# Patient Record
Sex: Female | Born: 1935 | Race: White | Hispanic: No | Smoking: Never smoker
Health system: Southern US, Community
[De-identification: ages and names within clinical notes are randomized; demographics above are authoritative.]

## PROBLEM LIST (undated history)

## (undated) DIAGNOSIS — R079 Chest pain, unspecified: Secondary | ICD-10-CM

## (undated) DIAGNOSIS — E113299 Type 2 diabetes mellitus with mild nonproliferative diabetic retinopathy without macular edema, unspecified eye: Secondary | ICD-10-CM

## (undated) DIAGNOSIS — E7800 Pure hypercholesterolemia, unspecified: Secondary | ICD-10-CM

## (undated) DIAGNOSIS — J302 Other seasonal allergic rhinitis: Secondary | ICD-10-CM

## (undated) DIAGNOSIS — R3915 Urgency of urination: Secondary | ICD-10-CM

## (undated) DIAGNOSIS — G20A1 Parkinson's disease without dyskinesia, without mention of fluctuations: Secondary | ICD-10-CM

## (undated) DIAGNOSIS — G20 Parkinson's disease: Secondary | ICD-10-CM

## (undated) DIAGNOSIS — E1142 Type 2 diabetes mellitus with diabetic polyneuropathy: Secondary | ICD-10-CM

## (undated) DIAGNOSIS — I10 Essential (primary) hypertension: Secondary | ICD-10-CM

## (undated) DIAGNOSIS — K219 Gastro-esophageal reflux disease without esophagitis: Secondary | ICD-10-CM

## (undated) DIAGNOSIS — M790 Rheumatism, unspecified: Secondary | ICD-10-CM

## (undated) DIAGNOSIS — M5126 Other intervertebral disc displacement, lumbar region: Secondary | ICD-10-CM

## (undated) DIAGNOSIS — E785 Hyperlipidemia, unspecified: Secondary | ICD-10-CM

## (undated) DIAGNOSIS — E119 Type 2 diabetes mellitus without complications: Secondary | ICD-10-CM

## (undated) DIAGNOSIS — I259 Chronic ischemic heart disease, unspecified: Secondary | ICD-10-CM

## (undated) DIAGNOSIS — M1990 Unspecified osteoarthritis, unspecified site: Secondary | ICD-10-CM

## (undated) DIAGNOSIS — M5136 Other intervertebral disc degeneration, lumbar region: Secondary | ICD-10-CM

## (undated) DIAGNOSIS — I272 Pulmonary hypertension, unspecified: Secondary | ICD-10-CM

## (undated) DIAGNOSIS — M722 Plantar fascial fibromatosis: Secondary | ICD-10-CM

## (undated) DIAGNOSIS — B029 Zoster without complications: Secondary | ICD-10-CM

## (undated) DIAGNOSIS — I739 Peripheral vascular disease, unspecified: Secondary | ICD-10-CM

## (undated) HISTORY — DX: Peripheral vascular disease, unspecified: I73.9

## (undated) HISTORY — DX: Other intervertebral disc degeneration, lumbar region: M51.36

## (undated) HISTORY — DX: Urgency of urination: R39.15

## (undated) HISTORY — PX: TUBAL LIGATION: SHX77

## (undated) HISTORY — DX: Rheumatism, unspecified: M79.0

## (undated) HISTORY — DX: Type 2 diabetes mellitus with mild nonproliferative diabetic retinopathy without macular edema, unspecified eye: E11.3299

## (undated) HISTORY — DX: Zoster without complications: B02.9

## (undated) HISTORY — PX: CATARACT EXTRACTION: SUR2

## (undated) HISTORY — DX: Unspecified osteoarthritis, unspecified site: M19.90

## (undated) HISTORY — PX: BACK SURGERY: SHX140

## (undated) HISTORY — PX: CHOLECYSTECTOMY: SHX55

## (undated) HISTORY — PX: APPENDECTOMY: SHX54

## (undated) HISTORY — DX: Gastro-esophageal reflux disease without esophagitis: K21.9

## (undated) HISTORY — DX: Other seasonal allergic rhinitis: J30.2

## (undated) HISTORY — DX: Pulmonary hypertension, unspecified: I27.20

## (undated) HISTORY — DX: Parkinson's disease without dyskinesia, without mention of fluctuations: G20.A1

## (undated) HISTORY — DX: Plantar fascial fibromatosis: M72.2

## (undated) HISTORY — DX: Hyperlipidemia, unspecified: E78.5

## (undated) HISTORY — DX: Chronic ischemic heart disease, unspecified: I25.9

## (undated) HISTORY — DX: Type 2 diabetes mellitus with diabetic polyneuropathy: E11.42

## (undated) HISTORY — DX: Other intervertebral disc displacement, lumbar region: M51.26

## (undated) HISTORY — DX: Chest pain, unspecified: R07.9

## (undated) HISTORY — PX: SHOULDER SURGERY: SHX246

## (undated) HISTORY — DX: Pure hypercholesterolemia, unspecified: E78.00

## (undated) HISTORY — DX: Parkinson's disease: G20

---

## 2003-09-27 ENCOUNTER — Ambulatory Visit (HOSPITAL_COMMUNITY): Admission: RE | Admit: 2003-09-27 | Discharge: 2003-09-27 | Payer: Self-pay | Admitting: Neurosurgery

## 2003-10-09 ENCOUNTER — Inpatient Hospital Stay (HOSPITAL_COMMUNITY): Admission: RE | Admit: 2003-10-09 | Discharge: 2003-10-16 | Payer: Self-pay | Admitting: Neurosurgery

## 2003-10-13 ENCOUNTER — Encounter: Payer: Self-pay | Admitting: Cardiology

## 2008-02-14 ENCOUNTER — Encounter: Admission: RE | Admit: 2008-02-14 | Discharge: 2008-02-14 | Payer: Self-pay | Admitting: Neurology

## 2008-05-27 ENCOUNTER — Encounter: Admission: RE | Admit: 2008-05-27 | Discharge: 2008-05-27 | Payer: Self-pay | Admitting: Neurology

## 2008-08-21 ENCOUNTER — Encounter: Admission: RE | Admit: 2008-08-21 | Discharge: 2008-08-21 | Payer: Self-pay | Admitting: Neurology

## 2010-10-28 NOTE — Discharge Summary (Signed)
NAME:  Cynthia Dunlap, Cynthia Dunlap                           ACCOUNT NO.:  [id number]   MEDICAL RECORD NO.:  [id number]                   PATIENT TYPE:  INP   LOCATION:  3030                                 FACILITY:  [facility]   PHYSICIAN:  Hilda Lias, M.D.                DATE OF BIRTH:  06-Jun-1934   DATE OF ADMISSION:  10/09/2003  DATE OF DISCHARGE:  10/16/2003                                 DISCHARGE SUMMARY   ADMISSION DIAGNOSIS:  L5-S1 herniated disk with stenosis at the level of L4-  5.   FINAL DIAGNOSES:  1. L5-S1 herniated disk with stenosis at the level of L4-5.  2. Hypoxia, rule out sleep apnea.   CLINICAL HISTORY:  The patient was admitted because of chronic back pain  with radiation down to the leg.  X-rays showed that she has stenosis at the  level of L4-5 and a probable disk at the level of L5-S1.  Surgery was  advised.   LABORATORIES:  Normal at the beginning.  The only finding was after 72 hours  the oxygen being in the low 70s with an x-ray that showed pulmonary edema.   COURSE IN THE HOSPITAL:  The patient was taken to surgery and decompression  of the L4-5 and L5-S1 space was done.  The patient did really well.  She was  doing great, but her oxygen saturation was in the low 90s.  Oxygen was  given, but every time the oxygen was removed, her oxygen saturation dropped  into the 40s.  A pulmonary consult was made.  Chest x-ray showed that the  patient might have the possibility of pulmonary edema to rule out the  possibility of MI versus pulmonary emboli.  The patient has complete cardiac  workup, as well as of her lungs also.  The findings showed no evidence of  any pulmonary emboli and the cardiology workup was negative for MI.  Today  she is feeling much better.  After being cleared by the cardiologist and the  pulmonologist, she is being discharged to be followed by Dr. Delford Field, as well  as by me.   CONDITION ON DISCHARGE:  Improving.   MEDICATIONS:  She is going to be  taking Avelox, Advair, as well as Percocet  for pain.   DIET:  Regular.   ACTIVITY:  She is not to do any lifting.  She is not to bend.   FOLLOWUP:  She will be seen by Dr. Delford Field, as well as by me in my office.   CONDITION ON DISCHARGE:  Overall improvement of her medical and surgical  conditions.                                                Hilda Lias, M.D.   EB/MEDQ  D:  10/16/2003  T:  10/18/2003  Job:  [id number]

## 2010-10-28 NOTE — Consult Note (Signed)
NAME:  Cynthia Dunlap, Cynthia Dunlap                           ACCOUNT NO.:  [id number]   MEDICAL RECORD NO.:  [id number]                   PATIENT TYPE:  INP   LOCATION:  3104                                 FACILITY:  [facility]   PHYSICIAN:  Learta Codding, M.D. [facility]             DATE OF BIRTH:  03-30-1934   DATE OF CONSULTATION:  10/13/2003  DATE OF DISCHARGE:                                   CONSULTATION   REFERRING PHYSICIAN:  Shan Levans, M.D.   REFERRING SURGEON:  Hilda Lias, M.D.   CARDIOLOGIST:  Jaclynn Major, M.D.   REASON FOR CONSULTATION:  Evaluation of a 77 year old female with hypoxemia  and with concern of congestive heart failure.   HISTORY OF PRESENT ILLNESS:  The patient is a 77 year old female with a  prior cardiac history of prior myocardial infarction in 1993.  The patient  was recently admitted for back surgery.  She had a left L5-S1 herniated disk  with compromise of L5 and S1 nerve root.  She is now status post surgery  requiring foraminotomy and diskectomy.  The patient over the last day was  noted to have hypoxemia.  One report stated that the patient's saturation  may have dropped down to the 40% range.  However, clinically the patient has  had no complaints of chest pain or shortness of breath.  As a matter of  fact, the patient never at any given time was tachypneic.  She did receive a  significant amount of sedation and narcotics, which may actually have caused  some respiratory depression.  At the time of the patient's move to the ICU,  her respirations are only still nine per minute.  Critical care consultation  was called for.  Dr. Delford Field notified Dr. Andee Lineman for an evaluation of  possible congestive heart failure.  Chest x-ray demonstrated possible  congestive heart failure but clinically the patient does not appear to have  heart failure.  Saturation now also is 95%, and the blood gas demonstrates a  primary metabolic alkalosis.  The patient states that she  had had a recent  stress test approximately 14 months ago, which was negative for ischemia.  This was done in [place].  She never had a prior cardiac catheterization  done, however.  The patient has cardiac risk factors including diabetes and  hypertension and is significantly obese.  A 12-lead electrocardiogram does  not demonstrate any acute infarction or ischemia.  A bedside  echocardiographic study demonstrated normal LV function, no definite  segmental wall motion abnormalities.  An official echocardiographic report,  however, is pending.  The patient is now moved to the neuro ICU at 3100 unit  in no distress and no definite complaints. She diuresed approximately 2.2 L  even without the use of diuretics.  She still appears to be a little bit  somnolent.  She also appears pale, and she had a significant drop  in her  hemoglobin from 13.8 to 9.9.  A repeat CBC is pending.   ALLERGIES:  PENICILLIN, DARVON.   MEDICATIONS:  Colace, Neurontin, Actos, hydrochlorothiazide, __________,  Lipitor, Norvasc, Elavil, and insulin NPH.   PAST MEDICAL HISTORY:  1. As outlined above.  History of myocardial infarction in 1993 but details     are unclear.  Negative stress test 14 months ago.  2. Diabetes mellitus type 2.  3. Hypertension.  4. Obesity.   SOCIAL HISTORY:  The patient lives with her husband in [place].  She does  not smoke or drink.   FAMILY HISTORY:  Notable for two sisters who have diabetes but otherwise no  known heart disease.   REVIEW OF SYSTEMS:  The patient reports fever and chills over the last  several days with a temperature of 101.4 post surgery.  She does report some  shortness of breath on exertion, is currently not dyspneic, and denies any  substernal chest pain.  She has numbness secondary to lower extremity  polyneuropathy.  No frequency or dysuria.  No myalgias or arthralgias.  No  nausea and vomiting.   PHYSICAL EXAMINATION:  VITAL SIGNS:  Blood pressure  154/   Dictation ended at this point.                                               Learta Codding, M.D. [facility]    GED/MEDQ  D:  10/13/2003  T:  10/13/2003  Job:  [id number]   cc:   Shan Levans, M.D. [facility]   Hilda Lias, M.D.  [address]  [place], [place] [address]  Fax: [phone]

## 2010-10-28 NOTE — H&P (Signed)
NAME:  LATAYNA, RITCHIE                           ACCOUNT NO.:  [id number]   MEDICAL RECORD NO.:  [id number]                   PATIENT TYPE:  INP   LOCATION:  2899                                 FACILITY:  [facility]   PHYSICIAN:  Hilda Lias, M.D.                DATE OF BIRTH:  [birth year]   DATE OF ADMISSION:  10/09/2003  DATE OF DISCHARGE:                                HISTORY & PHYSICAL   HISTORY OF PRESENT ILLNESS:  Mrs. Probus is a lady who was seen in my office  on several occasions because of back pain with radiation down to the left  leg and numbness for several years.  The pain goes to the left hip and then  goes to the left thigh.  Now, she has been complaining of numbness in the  left foot.  The patient denies any problem with the right leg.  The patient  had conservative treatment without improvement.  She denies any pain or  swelling with the right leg.   PAST MEDICAL HISTORY:  Surgery on both shoulders.   ALLERGIES:  She is allergic to PENICILLIN and DARVON.   SOCIAL HISTORY:  Social history is negative.   FAMILY HISTORY:  Family history is unremarkable.   REVIEW OF SYSTEMS:  Review of systems positive for diabetes and high blood  pressure.   PHYSICAL EXAMINATION:  GENERAL:  A patient who came to my office on 2  occasions, limping from the left leg.  HEENT:  Normal.  NECK:  Normal.  LUNGS:  Lungs clear.  HEART:  Heart sounds normal.  ABDOMEN:  Normal.  EXTREMITIES:  Normal pulses.  NEUROLOGIC:  Mental status normal.  Cranial nerves normal.  Strength:  She  has weakness on dorsiflexion of the left foot.  Reflexes are symmetrical.  She has difficulty walking on tiptoes with the left leg.   IMAGING STUDIES:  The MRI showed that she has a herniated disk at the level  of 5-1, far lateral, and compromising the L5 nerve root.   CLINICAL IMPRESSION:  Left L5-S1 herniated disk with compromise of the left  L5 and S1 nerve root .   RECOMMENDATION:  The patient is being  admitted for surgery.  The procedure  will be to decompress the nerve root as well as foraminotomy and diskectomy.  She knows about the risks such as infection, CSF leak, worsening of the  pain, paralysis, need for further surgery.  Also, she knows that she might  require surgery in the future which might require fusion.  Also, because of  her diabetes, there is a possibility that the numbness will not get better.  Hilda Lias, M.D.    EB/MEDQ  D:  10/09/2003  T:  10/09/2003  Job:  [id number]

## 2010-10-28 NOTE — Op Note (Signed)
NAME:  Cynthia Dunlap, Cynthia Dunlap                            ACCOUNT NO.:  [id number]   MEDICAL RECORD NO.:  [id number]                   PATIENT TYPE:  INP   LOCATION:                                       FACILITY:  [facility]   PHYSICIAN:  Hilda Lias, M.D.                DATE OF BIRTH:  1934-01-16   DATE OF PROCEDURE:  10/09/2003  DATE OF DISCHARGE:                                 OPERATIVE REPORT   PREOPERATIVE DIAGNOSIS:  Left L4-5 stenosis with possible L5-S1 herniated  disk.   POSTOPERATIVE DIAGNOSIS:  Left L5-S1 herniated disk with a fragment  compromising the take off of S1 nerve root.  Left L4-5 stenosis.   OPERATION PERFORMED:  Left L5 hemilaminectomy, 4-5 foraminotomy, 5-1  diskectomy.  Microscope.   SURGEON:  Hilda Lias, M.D.   ASSISTANT:  Coletta Memos, M.D.   ANESTHESIA:  General.   INDICATIONS FOR PROCEDURE:  The patient was admitted because of back pain  with radiation down to the left leg for many years.  The patient has failed  with conservative treatment.  X-ray showed that she has stenosis at the  level of 4-5, posterior disc and posterior foraminal disc at L5-1.  In view  of no improvement, patient wanted to proceed with surgery.  The risks were  explained in history and physical.   DESCRIPTION OF PROCEDURE:  The patient was taken to the operating room and  after intubation, she was positioned in a prone manner.  The back was  prepped with Betadine.  We brought the microscope immediately because of the  size of the patient and to have better light.  Because of the MRI clinically  the weakness of dorsi and plantar flexion, we went ahead and did  hemilaminectomy of L5.  We looked at the L4-5 space and it was quite narrow.  Foraminotomy to decompress the L4 and L5 nerve root was achieved.  The disk  was bulging,  but there was no need to do diskectomy.  Then we found the  area of L5-S1.  Indeed there was a calcified disk with a fragment glued to  the anterior part of  S1 nerve root.  Removal was made.  We made incision  into the disk space and total gross diskectomy of degenerative disk was  achieved.  At the end we had good decompression.  There was a small pin hole  in the dura mater and a single stitch of Prolene was used followed by  BioGlue.  Valsalva maneuver prior to that was negative.  The area was  irrigated and closed with Vicryl and Steri-Strips.                                               Hilda Lias, M.D.  EB/MEDQ  D:  10/09/2003  T:  10/10/2003  Job:  [id number]

## 2015-06-11 ENCOUNTER — Encounter (HOSPITAL_COMMUNITY): Payer: Self-pay | Admitting: Emergency Medicine

## 2015-06-11 ENCOUNTER — Emergency Department (HOSPITAL_COMMUNITY)
Admission: EM | Admit: 2015-06-11 | Discharge: 2015-06-11 | Disposition: A | Discharge status: Home / Self Care | Payer: Medicare FFS | Attending: Emergency Medicine | Admitting: Emergency Medicine

## 2015-06-11 DIAGNOSIS — Z79899 Other long term (current) drug therapy: Secondary | ICD-10-CM | POA: Insufficient documentation

## 2015-06-11 DIAGNOSIS — E119 Type 2 diabetes mellitus without complications: Secondary | ICD-10-CM | POA: Diagnosis not present

## 2015-06-11 DIAGNOSIS — I10 Essential (primary) hypertension: Secondary | ICD-10-CM | POA: Insufficient documentation

## 2015-06-11 DIAGNOSIS — E669 Obesity, unspecified: Secondary | ICD-10-CM | POA: Insufficient documentation

## 2015-06-11 DIAGNOSIS — B029 Zoster without complications: Secondary | ICD-10-CM

## 2015-06-11 DIAGNOSIS — Z880 Allergy status to penicillin: Secondary | ICD-10-CM | POA: Insufficient documentation

## 2015-06-11 DIAGNOSIS — Z794 Long term (current) use of insulin: Secondary | ICD-10-CM | POA: Insufficient documentation

## 2015-06-11 DIAGNOSIS — Z792 Long term (current) use of antibiotics: Secondary | ICD-10-CM | POA: Insufficient documentation

## 2015-06-11 DIAGNOSIS — R109 Unspecified abdominal pain: Secondary | ICD-10-CM | POA: Diagnosis present

## 2015-06-11 HISTORY — DX: Type 2 diabetes mellitus without complications: E11.9

## 2015-06-11 HISTORY — DX: Essential (primary) hypertension: I10

## 2015-06-11 LAB — URINALYSIS, ROUTINE W REFLEX MICROSCOPIC
BILIRUBIN URINE: NEGATIVE
GLUCOSE, UA: NEGATIVE mg/dL
HGB URINE DIPSTICK: NEGATIVE
KETONES UR: NEGATIVE mg/dL
Nitrite: NEGATIVE
PH: 6 (ref 5.0–8.0)
Protein, ur: NEGATIVE mg/dL
Specific Gravity, Urine: 1.01 (ref 1.005–1.030)

## 2015-06-11 LAB — URINE MICROSCOPIC-ADD ON
Bacteria, UA: NONE SEEN
RBC / HPF: NONE SEEN RBC/hpf (ref 0–5)
Squamous Epithelial / LPF: NONE SEEN

## 2015-06-11 MED ORDER — OXYCODONE-ACETAMINOPHEN 5-325 MG PO TABS
1.0000 | ORAL_TABLET | Freq: Once | ORAL | Status: AC
Start: 2015-06-11 — End: 2015-06-11
  Administered 2015-06-11: 1 via ORAL
  Filled 2015-06-11: qty 1

## 2015-06-11 MED ORDER — VALACYCLOVIR HCL 1 G PO TABS: 1000.0000 mg | ORAL_TABLET | Freq: Three times a day (TID) | ORAL | Status: AC

## 2015-06-11 MED ORDER — VALACYCLOVIR HCL 500 MG PO TABS
1000.0000 mg | ORAL_TABLET | Freq: Once | ORAL | Status: AC
  Administered 2015-06-11: 1000 mg via ORAL
  Filled 2015-06-11: qty 2

## 2015-06-11 MED ORDER — OXYCODONE-ACETAMINOPHEN 5-325 MG PO TABS: 1.0000 | ORAL_TABLET | ORAL | Status: DC | PRN

## 2015-06-11 MED ORDER — VALACYCLOVIR HCL 1 G PO TABS: 1000.0000 mg | ORAL_TABLET | Freq: Three times a day (TID) | ORAL | Status: DC

## 2015-06-11 MED ORDER — PREDNISONE 50 MG PO TABS: 50.0000 mg | ORAL_TABLET | Freq: Once | ORAL | Status: DC

## 2015-06-11 MED ORDER — ONDANSETRON HCL 8 MG PO TABS: 8.0000 mg | ORAL_TABLET | ORAL | Status: DC | PRN

## 2015-06-11 MED ORDER — PREDNISONE 20 MG PO TABS: 20.0000 mg | ORAL_TABLET | Freq: Every day | ORAL | Status: DC

## 2015-06-11 MED ORDER — PREDNISONE 50 MG PO TABS: ORAL_TABLET | ORAL | Status: DC

## 2015-06-11 NOTE — ED Provider Notes (Signed)
History  By signing my name below, I, Karle PlumberJennifer Tensley, attest that this documentation has been prepared under the direction and in the presence of Donnetta HutchingBrian Nyzier Boivin, MD. Electronically Signed: Karle PlumberJennifer Tensley, ED Scribe. 06/11/2015. 11:03 AM.  Chief Complaint  Patient presents with  . Flank Pain   The history is provided by the patient and medical records. No language interpreter was used.    HPI Comments:  Cynthia Dunlap is a 81 y.o. obese female with PMHx of DM and HTN who presents to the Emergency Department complaining of severe, superficial left-sided abdominal/flank pain that began a few weeks ago. She states the pain radiates towards her back and around into her abdomen. There is a small rash noted to the area as well that she reports scratching. She has not done anything to treat her pain. She states the waistband from her pants touching the area intensifies the pain. She denies alleviating factors. She denies fever, chills, nausea or vomiting.  Past Medical History  Diagnosis Date  . Hypertension   . Diabetes mellitus without complication [facility](HCC)    Past Surgical History  Procedure Laterality Date  . Back surgery    . Cholecystectomy     History reviewed. No pertinent family history. Social History  Substance Use Topics  . Smoking status: Never Smoker   . Smokeless tobacco: None  . Alcohol Use: None   OB History    No data available     Review of Systems A complete 10 system review of systems was obtained and all systems are negative except as noted in the HPI and PMH.   Allergies  Darvon and Penicillins  Home Medications   Prior to Admission medications   Medication Sig Start Date End Date Taking? Authorizing Provider  amitriptyline (ELAVIL) 100 MG tablet Take 100 mg by mouth at bedtime.   Yes Historical Provider, MD  amLODipine (NORVASC) 10 MG tablet Take 1 tablet by mouth daily. 03/30/15  Yes Historical Provider, MD  atorvastatin (LIPITOR) 80 MG tablet Take 1  tablet by mouth daily. 05/22/15  Yes Historical Provider, MD  Cholecalciferol (VITAMIN D-3) 1000 units CAPS Take 2,000 Units by mouth daily.   Yes Historical Provider, MD  ciprofloxacin (CIPRO) 250 MG tablet Take 1 tablet by mouth 2 (two) times daily. 06/07/15  Yes Historical Provider, MD  clonazePAM (KLONOPIN) 0.5 MG tablet Take 0.5 mg by mouth at bedtime.   Yes Historical Provider, MD  L-Methylfolate-B6-B12 (FOLTANX) 3-35-2 MG TABS Take 1 tablet by mouth 2 (two) times daily.  05/22/15  Yes Historical Provider, MD  losartan (COZAAR) 100 MG tablet Take 100 mg by mouth daily.   Yes Historical Provider, MD  NOVOLIN N RELION 100 UNIT/ML injection Inject 60 Units into the skin 2 (two) times daily.  05/26/15  Yes Historical Provider, MD  oxyCODONE-acetaminophen (PERCOCET) 5-325 MG tablet Take 1 tablet by mouth every 4 (four) hours as needed. 06/11/15   Donnetta HutchingBrian Rameses Ou, MD  predniSONE (DELTASONE) 20 MG tablet Take 1 tablet (20 mg total) by mouth daily with breakfast. 06/11/15   Donnetta HutchingBrian Laniesha Das, MD  predniSONE (DELTASONE) 50 MG tablet One tab daily for 7 days;  One half tab daily for 7 days 06/11/15   Donnetta HutchingBrian Aviance [place]wood, MD  valACYclovir (VALTREX) 1000 MG tablet Take 1 tablet (1,000 mg total) by mouth 3 (three) times daily. 06/11/15 06/25/15  Donnetta HutchingBrian Jareb Radoncic, MD   Triage Vitals: Pulse 101  Temp(Src) 98.1 F (36.7 C) (Oral)  Resp 18  Ht 5\' 9"  (1.753 m)  Wt 205 lb (92.987 kg)  BMI 30.26 kg/m2  SpO2 97% Physical Exam  Constitutional: She is oriented to person, place, and time. She appears well-developed and well-nourished.  HENT:  Head: Normocephalic and atraumatic.  Eyes: Conjunctivae and EOM are normal. Pupils are equal, round, and reactive to light.  Neck: Normal range of motion. Neck supple.  Cardiovascular: Normal rate and regular rhythm.   Pulmonary/Chest: Effort normal and breath sounds normal.  Abdominal: Soft. Bowel sounds are normal.  Musculoskeletal: Normal range of motion.  Neurological: She is alert and  oriented to person, place, and time.  Skin: Skin is warm and dry.  Erythematous, papular, vesicular posterior lateral rash in left T-12 dermatome.  Psychiatric: She has a normal mood and affect. Her behavior is normal.  Nursing note and vitals reviewed.   ED Course  Procedures (including critical care time) DIAGNOSTIC STUDIES: Oxygen Saturation is 97% on RA, normal by my interpretation.   COORDINATION OF CARE: 10:23 AM- Will prescribe antiviral medication and pain medication. Will give first dose of each prior to discharge. Pt verbalizes understanding and agrees to plan.  Medications  oxyCODONE-acetaminophen (PERCOCET/ROXICET) 5-325 MG per tablet 1 tablet (not administered)  valACYclovir (VALTREX) tablet 1,000 mg (not administered)    Labs Review Labs Reviewed  URINALYSIS, ROUTINE W REFLEX MICROSCOPIC (NOT AT [facility]) - Abnormal; Notable for the following:    Leukocytes, UA TRACE (*)    All other components within normal limits  URINE MICROSCOPIC-ADD ON    MDM   Final diagnoses:  Shingles    History and physical consistent with shingles. Rx valacyclovir, Percocet, prednisone. She is nontoxic-appearing.  I personally performed the services described in this documentation, which was scribed in my presence. The recorded information has been reviewed and is accurate.      Donnetta Hutching, MD 06/11/15 [phone]

## 2015-06-11 NOTE — Discharge Instructions (Signed)
Shingles Shingles is an infection that causes a painful skin rash and fluid-filled blisters. Shingles is caused by the same virus that causes chickenpox. Shingles only develops in people who:  Have had chickenpox.  Have gotten the chickenpox vaccine. (This is rare.) The first symptoms of shingles may be itching, tingling, or pain in an area on your skin. A rash will follow in a few days or weeks. The rash is usually on one side of the body in a bandlike or beltlike pattern. Over time, the rash turns into fluid-filled blisters that break open, scab over, and dry up. Medicines may:  Help you manage pain.  Help you recover more quickly.  Help to prevent long-term problems. HOME CARE Medicines  Take medicines only as told by your doctor.  Apply an anti-itch or numbing cream to the affected area as told by your doctor. Blister and Rash Care  Take a cool bath or put cool compresses on the area of the rash or blisters as told by your doctor. This may help with pain and itching.  Keep your rash covered with a loose bandage (dressing). Wear loose-fitting clothing.  Keep your rash and blisters clean with mild soap and cool water or as told by your doctor.  Check your rash every day for signs of infection. These include redness, swelling, and pain that lasts or gets worse.  Do not pick your blisters.  Do not scratch your rash. General Instructions  Rest as told by your doctor.  Keep all follow-up visits as told by your doctor. This is important.  Until your blisters scab over, your infection can cause chickenpox in people who have never had it or been vaccinated against it. To prevent this from happening, avoid touching other people or being around other people, especially:  Babies.  Pregnant women.  Children who have eczema.  Elderly people who have transplants.  People who have chronic illnesses, such as leukemia or AIDS. GET HELP IF:  Your pain does not get better with  medicine.  Your pain does not get better after the rash heals.  Your rash looks infected. Signs of infection include:  Redness.  Swelling.  Pain that lasts or gets worse. GET HELP RIGHT AWAY IF:  The rash is on your face or nose.  You have pain in your face, pain around your eye area, or loss of feeling on one side of your face.  You have ear pain or you have ringing in your ear.  You have loss of taste.  Your condition gets worse.   This information is not intended to replace advice given to you by your health care provider. Make sure you discuss any questions you have with your health care provider.   Document Released: 11/15/2007 Document Revised: 06/19/2014 Document Reviewed: 03/10/2014 Elsevier Interactive Patient Education 2016 [employer]   You have been diagnosed with shingles. Prescription for antiviral medication, pain medicine, prednisone. Follow-up your primary care doctor.

## 2015-06-11 NOTE — ED Notes (Signed)
Pt reports left flank pain for last several weeks. [facility], urine sample collected and pt was prescribed cipro. Pt rpeorts dysuria has stopped but reports continued pain. Pt denies any fever,n/v.

## 2020-03-18 ENCOUNTER — Encounter (HOSPITAL_COMMUNITY): Payer: Self-pay

## 2020-03-18 ENCOUNTER — Emergency Department (HOSPITAL_COMMUNITY): Payer: Medicare PPO

## 2020-03-18 ENCOUNTER — Observation Stay (HOSPITAL_COMMUNITY)
Admission: EM | Admit: 2020-03-18 | Discharge: 2020-03-21 | Disposition: A | Discharge status: Home / Self Care | Payer: Medicare PPO | Attending: Internal Medicine | Admitting: Internal Medicine

## 2020-03-18 ENCOUNTER — Observation Stay (HOSPITAL_COMMUNITY): Payer: Medicare PPO

## 2020-03-18 ENCOUNTER — Other Ambulatory Visit: Payer: Self-pay

## 2020-03-18 DIAGNOSIS — D696 Thrombocytopenia, unspecified: Secondary | ICD-10-CM

## 2020-03-18 DIAGNOSIS — Z20822 Contact with and (suspected) exposure to covid-19: Secondary | ICD-10-CM | POA: Diagnosis not present

## 2020-03-18 DIAGNOSIS — R413 Other amnesia: Secondary | ICD-10-CM | POA: Insufficient documentation

## 2020-03-18 DIAGNOSIS — Z794 Long term (current) use of insulin: Secondary | ICD-10-CM | POA: Diagnosis not present

## 2020-03-18 DIAGNOSIS — Z7982 Long term (current) use of aspirin: Secondary | ICD-10-CM | POA: Insufficient documentation

## 2020-03-18 DIAGNOSIS — S0990XA Unspecified injury of head, initial encounter: Secondary | ICD-10-CM | POA: Diagnosis present

## 2020-03-18 DIAGNOSIS — D539 Nutritional anemia, unspecified: Secondary | ICD-10-CM

## 2020-03-18 DIAGNOSIS — S0003XA Contusion of scalp, initial encounter: Principal | ICD-10-CM

## 2020-03-18 DIAGNOSIS — S40011A Contusion of right shoulder, initial encounter: Secondary | ICD-10-CM | POA: Insufficient documentation

## 2020-03-18 DIAGNOSIS — M549 Dorsalgia, unspecified: Secondary | ICD-10-CM | POA: Diagnosis not present

## 2020-03-18 DIAGNOSIS — R262 Difficulty in walking, not elsewhere classified: Secondary | ICD-10-CM | POA: Insufficient documentation

## 2020-03-18 DIAGNOSIS — E119 Type 2 diabetes mellitus without complications: Secondary | ICD-10-CM | POA: Diagnosis not present

## 2020-03-18 DIAGNOSIS — S299XXA Unspecified injury of thorax, initial encounter: Secondary | ICD-10-CM | POA: Insufficient documentation

## 2020-03-18 DIAGNOSIS — F0150 Vascular dementia without behavioral disturbance: Secondary | ICD-10-CM

## 2020-03-18 DIAGNOSIS — I10 Essential (primary) hypertension: Secondary | ICD-10-CM | POA: Diagnosis not present

## 2020-03-18 DIAGNOSIS — R55 Syncope and collapse: Secondary | ICD-10-CM

## 2020-03-18 DIAGNOSIS — E11649 Type 2 diabetes mellitus with hypoglycemia without coma: Secondary | ICD-10-CM | POA: Insufficient documentation

## 2020-03-18 DIAGNOSIS — S0093XA Contusion of unspecified part of head, initial encounter: Secondary | ICD-10-CM | POA: Diagnosis not present

## 2020-03-18 DIAGNOSIS — Y92410 Unspecified street and highway as the place of occurrence of the external cause: Secondary | ICD-10-CM | POA: Insufficient documentation

## 2020-03-18 DIAGNOSIS — R7989 Other specified abnormal findings of blood chemistry: Secondary | ICD-10-CM

## 2020-03-18 DIAGNOSIS — Z79899 Other long term (current) drug therapy: Secondary | ICD-10-CM | POA: Diagnosis not present

## 2020-03-18 DIAGNOSIS — R748 Abnormal levels of other serum enzymes: Secondary | ICD-10-CM

## 2020-03-18 DIAGNOSIS — S3991XA Unspecified injury of abdomen, initial encounter: Secondary | ICD-10-CM | POA: Insufficient documentation

## 2020-03-18 DIAGNOSIS — M6281 Muscle weakness (generalized): Secondary | ICD-10-CM | POA: Insufficient documentation

## 2020-03-18 DIAGNOSIS — I771 Stricture of artery: Secondary | ICD-10-CM

## 2020-03-18 LAB — COMPREHENSIVE METABOLIC PANEL
ALT: 47 U/L — ABNORMAL HIGH (ref 0–44)
AST: 87 U/L — ABNORMAL HIGH (ref 15–41)
Albumin: 3.7 g/dL (ref 3.5–5.0)
Alkaline Phosphatase: 56 U/L (ref 38–126)
Anion gap: 11 (ref 5–15)
BUN: 16 mg/dL (ref 8–23)
CO2: 23 mmol/L (ref 22–32)
Calcium: 9.2 mg/dL (ref 8.9–10.3)
Chloride: 105 mmol/L (ref 98–111)
Creatinine, Ser: 0.93 mg/dL (ref 0.44–1.00)
GFR calc non Af Amer: 56 mL/min — ABNORMAL LOW (ref >60)
Glucose, Bld: 84 mg/dL (ref 70–99)
Potassium: 4.4 mmol/L (ref 3.5–5.1)
Sodium: 139 mmol/L (ref 135–145)
Total Bilirubin: 1.3 mg/dL — ABNORMAL HIGH (ref 0.3–1.2)
Total Protein: 6.1 g/dL — ABNORMAL LOW (ref 6.5–8.1)

## 2020-03-18 LAB — I-STAT CHEM 8, ED
BUN: 21 mg/dL (ref 8–23)
Calcium, Ion: 1.14 mmol/L — ABNORMAL LOW (ref 1.15–1.40)
Chloride: 105 mmol/L (ref 98–111)
Creatinine, Ser: 0.8 mg/dL (ref 0.44–1.00)
Glucose, Bld: 80 mg/dL (ref 70–99)
HCT: 39 % (ref 36.0–46.0)
Hemoglobin: 13.3 g/dL (ref 12.0–15.0)
Potassium: 4.6 mmol/L (ref 3.5–5.1)
Sodium: 139 mmol/L (ref 135–145)
TCO2: 27 mmol/L (ref 22–32)

## 2020-03-18 LAB — CBC
HCT: 28.3 % — ABNORMAL LOW (ref 36.0–46.0)
Hemoglobin: 8.8 g/dL — ABNORMAL LOW (ref 12.0–15.0)
MCH: 32.1 pg (ref 26.0–34.0)
MCHC: 31.1 g/dL (ref 30.0–36.0)
MCV: 103.3 fL — ABNORMAL HIGH (ref 80.0–100.0)
Platelets: 117 10*3/uL — ABNORMAL LOW (ref 150–400)
RBC: 2.74 MIL/uL — ABNORMAL LOW (ref 3.87–5.11)
RDW: 12.4 % (ref 11.5–15.5)
WBC: 10.2 10*3/uL (ref 4.0–10.5)
nRBC: 0 % (ref 0.0–0.2)

## 2020-03-18 LAB — CBG MONITORING, ED: Glucose-Capillary: 89 mg/dL (ref 70–99)

## 2020-03-18 LAB — PROTIME-INR
INR: 1.2 (ref 0.8–1.2)
Prothrombin Time: 14.4 seconds (ref 11.4–15.2)

## 2020-03-18 LAB — SAMPLE TO BLOOD BANK

## 2020-03-18 LAB — RESPIRATORY PANEL BY RT PCR (FLU A&B, COVID)
Influenza A by PCR: NEGATIVE
Influenza B by PCR: NEGATIVE
SARS Coronavirus 2 by RT PCR: NEGATIVE

## 2020-03-18 LAB — LACTIC ACID, PLASMA: Lactic Acid, Venous: 1 mmol/L (ref 0.5–1.9)

## 2020-03-18 LAB — ETHANOL: Alcohol, Ethyl (B): <10 mg/dL (ref <10)

## 2020-03-18 MED ORDER — DONEPEZIL HCL 5 MG PO TABS
5.0000 mg | ORAL_TABLET | Freq: Every day | ORAL | Status: DC
  Administered 2020-03-19 – 2020-03-20 (×2): 5 mg via ORAL
  Filled 2020-03-18 (×3): qty 1

## 2020-03-18 MED ORDER — ACETAMINOPHEN 325 MG PO TABS
650.0000 mg | ORAL_TABLET | Freq: Four times a day (QID) | ORAL | Status: DC | PRN
  Administered 2020-03-19 – 2020-03-20 (×2): 650 mg via ORAL
  Filled 2020-03-18 (×3): qty 2

## 2020-03-18 MED ORDER — ONDANSETRON HCL 4 MG/2ML IJ SOLN
4.0000 mg | Freq: Four times a day (QID) | INTRAMUSCULAR | Status: DC | PRN
  Administered 2020-03-18: 4 mg via INTRAVENOUS
  Filled 2020-03-18: qty 2

## 2020-03-18 MED ORDER — LORATADINE 10 MG PO TABS
10.0000 mg | ORAL_TABLET | Freq: Every day | ORAL | Status: DC
  Administered 2020-03-18 – 2020-03-21 (×4): 10 mg via ORAL
  Filled 2020-03-18 (×4): qty 1

## 2020-03-18 MED ORDER — FENTANYL CITRATE (PF) 100 MCG/2ML IJ SOLN
50.0000 ug | Freq: Once | INTRAMUSCULAR | Status: AC
  Administered 2020-03-18: 50 ug via INTRAVENOUS
  Filled 2020-03-18: qty 2

## 2020-03-18 MED ORDER — TIZANIDINE HCL 4 MG PO TABS
2.0000 mg | ORAL_TABLET | Freq: Once | ORAL | Status: AC
  Administered 2020-03-18: 2 mg via ORAL
  Filled 2020-03-18: qty 1

## 2020-03-18 MED ORDER — IOHEXOL 300 MG/ML  SOLN
100.0000 mL | Freq: Once | INTRAMUSCULAR | Status: AC | PRN
  Administered 2020-03-18: 100 mL via INTRAVENOUS

## 2020-03-18 MED ORDER — IOHEXOL 350 MG/ML SOLN
50.0000 mL | Freq: Once | INTRAVENOUS | Status: AC | PRN
  Administered 2020-03-18: 50 mL via INTRAVENOUS

## 2020-03-18 MED ORDER — ACETAMINOPHEN 650 MG RE SUPP: 650.0000 mg | Freq: Four times a day (QID) | RECTAL | Status: DC | PRN

## 2020-03-18 MED ORDER — MIRABEGRON ER 25 MG PO TB24
25.0000 mg | ORAL_TABLET | Freq: Every day | ORAL | Status: DC
  Administered 2020-03-19 – 2020-03-21 (×3): 25 mg via ORAL
  Filled 2020-03-18 (×3): qty 1

## 2020-03-18 MED ORDER — HYDROCODONE-ACETAMINOPHEN 5-325 MG PO TABS
1.0000 | ORAL_TABLET | Freq: Once | ORAL | Status: AC
  Administered 2020-03-18: 1 via ORAL
  Filled 2020-03-18: qty 1

## 2020-03-18 MED ORDER — LEVOCETIRIZINE DIHYDROCHLORIDE 5 MG PO TABS: 5.0000 mg | ORAL_TABLET | Freq: Every evening | ORAL | Status: DC

## 2020-03-18 MED ORDER — AMITRIPTYLINE HCL 25 MG PO TABS
25.0000 mg | ORAL_TABLET | Freq: Every day | ORAL | Status: DC
  Administered 2020-03-18 – 2020-03-20 (×3): 25 mg via ORAL
  Filled 2020-03-18 (×3): qty 1

## 2020-03-18 MED ORDER — EZETIMIBE 10 MG PO TABS
10.0000 mg | ORAL_TABLET | Freq: Every day | ORAL | Status: DC
  Administered 2020-03-19 – 2020-03-21 (×3): 10 mg via ORAL
  Filled 2020-03-18 (×3): qty 1

## 2020-03-18 MED ORDER — INSULIN ASPART 100 UNIT/ML ~~LOC~~ SOLN
0.0000 [IU] | Freq: Three times a day (TID) | SUBCUTANEOUS | Status: DC
  Administered 2020-03-19: 3 [IU] via SUBCUTANEOUS
  Administered 2020-03-19: 5 [IU] via SUBCUTANEOUS
  Administered 2020-03-19 – 2020-03-20 (×2): 7 [IU] via SUBCUTANEOUS
  Administered 2020-03-20 – 2020-03-21 (×3): 3 [IU] via SUBCUTANEOUS
  Administered 2020-03-21: 7 [IU] via SUBCUTANEOUS

## 2020-03-18 MED ORDER — BISACODYL 5 MG PO TBEC: 5.0000 mg | DELAYED_RELEASE_TABLET | Freq: Every day | ORAL | Status: DC | PRN

## 2020-03-18 MED ORDER — ATORVASTATIN CALCIUM 80 MG PO TABS
80.0000 mg | ORAL_TABLET | Freq: Every day | ORAL | Status: DC
  Administered 2020-03-18 – 2020-03-20 (×3): 80 mg via ORAL
  Filled 2020-03-18 (×3): qty 1

## 2020-03-18 MED ORDER — METOPROLOL SUCCINATE ER 50 MG PO TB24
50.0000 mg | ORAL_TABLET | Freq: Every day | ORAL | Status: DC
  Administered 2020-03-19 – 2020-03-21 (×2): 50 mg via ORAL
  Filled 2020-03-18 (×3): qty 1

## 2020-03-18 MED ORDER — PANCRELIPASE (LIP-PROT-AMYL) 36000-114000 UNITS PO CPEP
36000.0000 [IU] | ORAL_CAPSULE | Freq: Three times a day (TID) | ORAL | Status: DC
  Administered 2020-03-19 – 2020-03-21 (×8): 36000 [IU] via ORAL
  Filled 2020-03-18 (×9): qty 1

## 2020-03-18 MED ORDER — ONDANSETRON HCL 4 MG PO TABS: 4.0000 mg | ORAL_TABLET | Freq: Four times a day (QID) | ORAL | Status: DC | PRN

## 2020-03-18 MED ORDER — INSULIN GLARGINE 100 UNIT/ML ~~LOC~~ SOLN
10.0000 [IU] | Freq: Every day | SUBCUTANEOUS | Status: DC
  Administered 2020-03-19 (×2): 10 [IU] via SUBCUTANEOUS
  Filled 2020-03-18 (×3): qty 0.1

## 2020-03-18 NOTE — ED Notes (Signed)
Patient transported to CT 
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
## 2020-03-18 NOTE — ED Notes (Signed)
Pt transported to MRI 
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
## 2020-03-18 NOTE — ED Notes (Addendum)
Patient transported to X-ray  3:08 PM Pt still off the floor in imaging

## 2020-03-19 ENCOUNTER — Observation Stay (HOSPITAL_BASED_OUTPATIENT_CLINIC_OR_DEPARTMENT_OTHER): Payer: Medicare PPO

## 2020-03-19 DIAGNOSIS — E119 Type 2 diabetes mellitus without complications: Secondary | ICD-10-CM | POA: Diagnosis not present

## 2020-03-19 DIAGNOSIS — I361 Nonrheumatic tricuspid (valve) insufficiency: Secondary | ICD-10-CM | POA: Diagnosis not present

## 2020-03-19 DIAGNOSIS — R55 Syncope and collapse: Secondary | ICD-10-CM

## 2020-03-19 LAB — HEMOGLOBIN A1C
Hgb A1c MFr Bld: 6.8 % — ABNORMAL HIGH (ref 4.8–5.6)
Mean Plasma Glucose: 148.46 mg/dL

## 2020-03-19 LAB — CBC
HCT: 36.4 % (ref 36.0–46.0)
Hemoglobin: 11.6 g/dL — ABNORMAL LOW (ref 12.0–15.0)
MCH: 32 pg (ref 26.0–34.0)
MCHC: 31.9 g/dL (ref 30.0–36.0)
MCV: 100.3 fL — ABNORMAL HIGH (ref 80.0–100.0)
Platelets: 168 10*3/uL (ref 150–400)
RBC: 3.63 MIL/uL — ABNORMAL LOW (ref 3.87–5.11)
RDW: 12.8 % (ref 11.5–15.5)
WBC: 13.4 10*3/uL — ABNORMAL HIGH (ref 4.0–10.5)
nRBC: 0 % (ref 0.0–0.2)

## 2020-03-19 LAB — URINALYSIS, ROUTINE W REFLEX MICROSCOPIC
Bilirubin Urine: NEGATIVE
Glucose, UA: NEGATIVE mg/dL
Ketones, ur: NEGATIVE mg/dL
Leukocytes,Ua: NEGATIVE
Nitrite: NEGATIVE
Protein, ur: NEGATIVE mg/dL
Specific Gravity, Urine: >1.046 — ABNORMAL HIGH (ref 1.005–1.030)
pH: 5 (ref 5.0–8.0)

## 2020-03-19 LAB — TROPONIN I (HIGH SENSITIVITY)
Troponin I (High Sensitivity): 16 ng/L (ref <18)
Troponin I (High Sensitivity): 18 ng/L — ABNORMAL HIGH (ref <18)

## 2020-03-19 LAB — COMPREHENSIVE METABOLIC PANEL
ALT: 48 U/L — ABNORMAL HIGH (ref 0–44)
AST: 79 U/L — ABNORMAL HIGH (ref 15–41)
Albumin: 3.6 g/dL (ref 3.5–5.0)
Alkaline Phosphatase: 56 U/L (ref 38–126)
Anion gap: 14 (ref 5–15)
BUN: 21 mg/dL (ref 8–23)
CO2: 20 mmol/L — ABNORMAL LOW (ref 22–32)
Calcium: 9.1 mg/dL (ref 8.9–10.3)
Chloride: 101 mmol/L (ref 98–111)
Creatinine, Ser: 1 mg/dL (ref 0.44–1.00)
GFR calc non Af Amer: 52 mL/min — ABNORMAL LOW (ref >60)
Glucose, Bld: 263 mg/dL — ABNORMAL HIGH (ref 70–99)
Potassium: 4.8 mmol/L (ref 3.5–5.1)
Sodium: 135 mmol/L (ref 135–145)
Total Bilirubin: 1.2 mg/dL (ref 0.3–1.2)
Total Protein: 6.1 g/dL — ABNORMAL LOW (ref 6.5–8.1)

## 2020-03-19 LAB — GLUCOSE, CAPILLARY
Glucose-Capillary: 285 mg/dL — ABNORMAL HIGH (ref 70–99)
Glucose-Capillary: 308 mg/dL — ABNORMAL HIGH (ref 70–99)
Glucose-Capillary: 228 mg/dL — ABNORMAL HIGH (ref 70–99)

## 2020-03-19 LAB — ECHOCARDIOGRAM COMPLETE
Area-P 1/2: 2.02 cm2
Height: 69 in
S' Lateral: 2.6 cm
Weight: 2752 oz

## 2020-03-19 LAB — CBG MONITORING, ED
Glucose-Capillary: 259 mg/dL — ABNORMAL HIGH (ref 70–99)
Glucose-Capillary: 220 mg/dL — ABNORMAL HIGH (ref 70–99)

## 2020-03-19 MED ORDER — HYDROCODONE-ACETAMINOPHEN 5-325 MG PO TABS
1.0000 | ORAL_TABLET | Freq: Four times a day (QID) | ORAL | Status: DC | PRN
  Administered 2020-03-19 – 2020-03-21 (×3): 1 via ORAL
  Filled 2020-03-19 (×3): qty 1

## 2020-03-19 NOTE — Care Management Obs Status (Signed)
MEDICARE OBSERVATION STATUS NOTIFICATION   Patient Details  Name: EXA BOMBA MRN: [id number] Date of Birth: Mar 14, 1934   Medicare Observation Status Notification Given:  Yes    Kermit Balo, RN 03/19/2020, 4:11 PM
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
## 2020-03-19 NOTE — Progress Notes (Signed)
  Echocardiogram 2D Echocardiogram has been attempted. Patient off the floor.  Lauree Yurick G Ronia Hazelett 03/19/2020, 8:50 AM

## 2020-03-19 NOTE — ED Notes (Addendum)
Pt states she has issues urinating at home and sometimes has to self cath. Pt states she has tried to urinate all day and has had no luck. NT from earlier had pt hooked up to a female purwick but still no luck with any output. This RN bladder scanned the pt and found 800 ml in her bladder. This RN had the secretary [facility] page admitting for possible in and out order. Will continue to monitor pt.

## 2020-03-19 NOTE — Progress Notes (Signed)
  Echocardiogram 2D Echocardiogram has been performed.  Cynthia Dunlap 03/19/2020, 10:19 AM
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
## 2020-03-19 NOTE — Plan of Care (Signed)
  Problem: Education: Goal: Knowledge of General Education information will improve Description Including pain rating scale, medication(s)/side effects and non-pharmacologic comfort measures Outcome: Progressing   

## 2020-03-19 NOTE — ED Notes (Signed)
PAGED DR Bruna Potter TO RN BRITTNEY--Cynthia Dunlap

## 2020-03-19 NOTE — Progress Notes (Signed)
Inpatient Diabetes Program Recommendations  AACE/ADA: New Consensus Statement on Inpatient Glycemic Control (2015)  Target Ranges:  Prepandial:   less than 140 mg/dL      Peak postprandial:   less than 180 mg/dL (1-2 hours)      Critically ill patients:  140 - 180 mg/dL   Lab Results  Component Value Date   GLUCAP 220 (H) 03/19/2020   HGBA1C 6.8 (H) 03/18/2020    Review of Glycemic Control Results for PERSAIS, ETHRIDGE (MRN [id number]) as of 03/19/2020 10:20  Ref. Range 03/19/2020 02:14 03/19/2020 07:35  Glucose-Capillary Latest Ref Range: 70 - 99 mg/dL 122 (H) 482 (H)   Diabetes history: Type 2 DM Outpatient Diabetes medications: Novolog 70/30 40 units BID Current orders for Inpatient glycemic control:Novolog 0-9 units TID, Lantus 10 units QHS  Inpatient Diabetes Program Recommendations:    Had low upon EMS presentation of 50 mg/dL. In agreement with current orders given new admit. May need to consider adjustment to insulin dosages at discharge given A1C.   Thanks, Lujean Rave, MSN, RNC-OB Diabetes Coordinator [phone] (8a-5p)

## 2020-03-19 NOTE — ED Notes (Signed)
Spoke with an attending who stated we needed to page floor coverage for concerns for the pt. This RN paged floor coverage from [facility]. Will continue to monitor pt.

## 2020-03-19 NOTE — ED Notes (Signed)
Pt sleeping in bed, does not appear in distress. Respirations are even and non-labored, call light within reach

## 2020-03-20 ENCOUNTER — Observation Stay (HOSPITAL_BASED_OUTPATIENT_CLINIC_OR_DEPARTMENT_OTHER): Payer: Medicare PPO

## 2020-03-20 DIAGNOSIS — D696 Thrombocytopenia, unspecified: Secondary | ICD-10-CM

## 2020-03-20 DIAGNOSIS — R55 Syncope and collapse: Secondary | ICD-10-CM | POA: Diagnosis not present

## 2020-03-20 DIAGNOSIS — E162 Hypoglycemia, unspecified: Secondary | ICD-10-CM | POA: Diagnosis not present

## 2020-03-20 DIAGNOSIS — D539 Nutritional anemia, unspecified: Secondary | ICD-10-CM | POA: Diagnosis not present

## 2020-03-20 DIAGNOSIS — I771 Stricture of artery: Secondary | ICD-10-CM

## 2020-03-20 DIAGNOSIS — E119 Type 2 diabetes mellitus without complications: Secondary | ICD-10-CM | POA: Diagnosis not present

## 2020-03-20 LAB — BASIC METABOLIC PANEL
Anion gap: 8 (ref 5–15)
BUN: 22 mg/dL (ref 8–23)
CO2: 25 mmol/L (ref 22–32)
Calcium: 8.7 mg/dL — ABNORMAL LOW (ref 8.9–10.3)
Chloride: 101 mmol/L (ref 98–111)
Creatinine, Ser: 0.91 mg/dL (ref 0.44–1.00)
GFR, Estimated: 58 mL/min — ABNORMAL LOW (ref >60)
Glucose, Bld: 263 mg/dL — ABNORMAL HIGH (ref 70–99)
Potassium: 4.6 mmol/L (ref 3.5–5.1)
Sodium: 134 mmol/L — ABNORMAL LOW (ref 135–145)

## 2020-03-20 LAB — CBC
HCT: 30 % — ABNORMAL LOW (ref 36.0–46.0)
Hemoglobin: 9.7 g/dL — ABNORMAL LOW (ref 12.0–15.0)
MCH: 32.1 pg (ref 26.0–34.0)
MCHC: 32.3 g/dL (ref 30.0–36.0)
MCV: 99.3 fL (ref 80.0–100.0)
Platelets: 134 10*3/uL — ABNORMAL LOW (ref 150–400)
RBC: 3.02 MIL/uL — ABNORMAL LOW (ref 3.87–5.11)
RDW: 13.1 % (ref 11.5–15.5)
WBC: 7.8 10*3/uL (ref 4.0–10.5)
nRBC: 0 % (ref 0.0–0.2)

## 2020-03-20 LAB — GLUCOSE, CAPILLARY
Glucose-Capillary: 241 mg/dL — ABNORMAL HIGH (ref 70–99)
Glucose-Capillary: 308 mg/dL — ABNORMAL HIGH (ref 70–99)
Glucose-Capillary: 238 mg/dL — ABNORMAL HIGH (ref 70–99)
Glucose-Capillary: 273 mg/dL — ABNORMAL HIGH (ref 70–99)

## 2020-03-20 MED ORDER — INSULIN GLARGINE 100 UNIT/ML ~~LOC~~ SOLN
15.0000 [IU] | Freq: Two times a day (BID) | SUBCUTANEOUS | Status: DC
  Administered 2020-03-20: 15 [IU] via SUBCUTANEOUS
  Filled 2020-03-20 (×3): qty 0.15
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
## 2020-03-20 NOTE — Progress Notes (Signed)
Carotid artery duplex completed. Refer to "CV Proc" under chart review to view preliminary results.  03/20/2020 11:16 AM Eula Fried., MHA, RVT, RDCS, RDMS

## 2020-03-21 ENCOUNTER — Observation Stay (HOSPITAL_COMMUNITY): Payer: Medicare PPO

## 2020-03-21 DIAGNOSIS — F0150 Vascular dementia without behavioral disturbance: Secondary | ICD-10-CM

## 2020-03-21 DIAGNOSIS — R7989 Other specified abnormal findings of blood chemistry: Secondary | ICD-10-CM

## 2020-03-21 DIAGNOSIS — D539 Nutritional anemia, unspecified: Secondary | ICD-10-CM | POA: Diagnosis not present

## 2020-03-21 DIAGNOSIS — R55 Syncope and collapse: Secondary | ICD-10-CM

## 2020-03-21 DIAGNOSIS — I771 Stricture of artery: Secondary | ICD-10-CM | POA: Diagnosis not present

## 2020-03-21 DIAGNOSIS — D696 Thrombocytopenia, unspecified: Secondary | ICD-10-CM | POA: Diagnosis not present

## 2020-03-21 DIAGNOSIS — R748 Abnormal levels of other serum enzymes: Secondary | ICD-10-CM

## 2020-03-21 LAB — CBC WITH DIFFERENTIAL/PLATELET
Abs Immature Granulocytes: 0.02 10*3/uL (ref 0.00–0.07)
Basophils Absolute: 0 10*3/uL (ref 0.0–0.1)
Basophils Relative: 1 %
Eosinophils Absolute: 0 10*3/uL (ref 0.0–0.5)
Eosinophils Relative: 0 %
HCT: 28.9 % — ABNORMAL LOW (ref 36.0–46.0)
Hemoglobin: 9.1 g/dL — ABNORMAL LOW (ref 12.0–15.0)
Immature Granulocytes: 0 %
Lymphocytes Relative: 26 %
Lymphs Abs: 1.5 10*3/uL (ref 0.7–4.0)
MCH: 31.3 pg (ref 26.0–34.0)
MCHC: 31.5 g/dL (ref 30.0–36.0)
MCV: 99.3 fL (ref 80.0–100.0)
Monocytes Absolute: 0.6 10*3/uL (ref 0.1–1.0)
Monocytes Relative: 10 %
Neutro Abs: 3.5 10*3/uL (ref 1.7–7.7)
Neutrophils Relative %: 63 %
Platelets: 108 10*3/uL — ABNORMAL LOW (ref 150–400)
RBC: 2.91 MIL/uL — ABNORMAL LOW (ref 3.87–5.11)
RDW: 13.1 % (ref 11.5–15.5)
WBC: 5.6 10*3/uL (ref 4.0–10.5)
nRBC: 0 % (ref 0.0–0.2)

## 2020-03-21 LAB — COMPREHENSIVE METABOLIC PANEL
ALT: 46 U/L — ABNORMAL HIGH (ref 0–44)
AST: 65 U/L — ABNORMAL HIGH (ref 15–41)
Albumin: 3.2 g/dL — ABNORMAL LOW (ref 3.5–5.0)
Alkaline Phosphatase: 57 U/L (ref 38–126)
Anion gap: 10 (ref 5–15)
BUN: 14 mg/dL (ref 8–23)
CO2: 25 mmol/L (ref 22–32)
Calcium: 8.8 mg/dL — ABNORMAL LOW (ref 8.9–10.3)
Chloride: 102 mmol/L (ref 98–111)
Creatinine, Ser: 0.85 mg/dL (ref 0.44–1.00)
GFR, Estimated: >60 mL/min (ref >60)
Glucose, Bld: 257 mg/dL — ABNORMAL HIGH (ref 70–99)
Potassium: 4.2 mmol/L (ref 3.5–5.1)
Sodium: 137 mmol/L (ref 135–145)
Total Bilirubin: 1.1 mg/dL (ref 0.3–1.2)
Total Protein: 5.7 g/dL — ABNORMAL LOW (ref 6.5–8.1)

## 2020-03-21 LAB — HEPATITIS PANEL, ACUTE
HCV Ab: NONREACTIVE
Hep A IgM: NONREACTIVE
Hep B C IgM: NONREACTIVE
Hepatitis B Surface Ag: NONREACTIVE

## 2020-03-21 LAB — GLUCOSE, CAPILLARY
Glucose-Capillary: 239 mg/dL — ABNORMAL HIGH (ref 70–99)
Glucose-Capillary: 313 mg/dL — ABNORMAL HIGH (ref 70–99)

## 2020-03-21 LAB — FOLATE: Folate: 21.8 ng/mL (ref >5.9)

## 2020-03-21 LAB — AMMONIA: Ammonia: 35 umol/L (ref 9–35)

## 2020-03-21 LAB — VITAMIN B12: Vitamin B-12: 345 pg/mL (ref 180–914)

## 2020-03-21 LAB — CK: Total CK: 874 U/L — ABNORMAL HIGH (ref 38–234)

## 2020-03-21 LAB — MAGNESIUM: Magnesium: 2 mg/dL (ref 1.7–2.4)

## 2020-03-21 MED ORDER — ATORVASTATIN CALCIUM 80 MG PO TABS: 80.0000 mg | ORAL_TABLET | Freq: Every day | ORAL | Status: AC

## 2020-03-21 MED ORDER — HYDROCODONE-ACETAMINOPHEN 5-325 MG PO TABS
1.0000 | ORAL_TABLET | Freq: Four times a day (QID) | ORAL | 0 refills | Status: DC | PRN
Start: 2020-03-21 — End: 2020-03-30

## 2020-03-21 MED ORDER — LOSARTAN POTASSIUM 50 MG PO TABS
50.0000 mg | ORAL_TABLET | Freq: Every day | ORAL | Status: DC
  Administered 2020-03-21: 50 mg via ORAL
  Filled 2020-03-21: qty 1

## 2020-03-21 MED ORDER — ASPIRIN 81 MG PO TBEC: 81.0000 mg | DELAYED_RELEASE_TABLET | Freq: Every day | ORAL | 12 refills | Status: AC

## 2020-03-21 MED ORDER — INSULIN GLARGINE 100 UNIT/ML ~~LOC~~ SOLN
20.0000 [IU] | Freq: Two times a day (BID) | SUBCUTANEOUS | Status: DC
  Administered 2020-03-21: 20 [IU] via SUBCUTANEOUS
  Filled 2020-03-21 (×2): qty 0.2

## 2020-03-21 NOTE — Progress Notes (Signed)
  Progress Note    03/21/2020 10:50 AM   Subjective: No acute issues  Vitals:   03/21/20 0417 03/21/20 0721  BP: 111/90 (!) 156/80  Pulse: 82 83  Resp: 19 16  Temp: 98.3 F (36.8 C) 98.2 F (36.8 C)  SpO2: 90% 98%    Physical Exam: Awake alert oriented Nonlabored respirations Moving all extremities without limitation Palpable radial artery pulses bilaterally  CBC    Component Value Date/Time   WBC 5.6 03/21/2020 0507   RBC 2.91 (L) 03/21/2020 0507   HGB 9.1 (L) 03/21/2020 0507   HCT 28.9 (L) 03/21/2020 0507   PLT 108 (L) 03/21/2020 0507   MCV 99.3 03/21/2020 0507   MCH 31.3 03/21/2020 0507   MCHC 31.5 03/21/2020 0507   RDW 13.1 03/21/2020 0507   LYMPHSABS 1.5 03/21/2020 0507   MONOABS 0.6 03/21/2020 0507   EOSABS 0.0 03/21/2020 0507   BASOSABS 0.0 03/21/2020 0507    BMET    Component Value Date/Time   NA 137 03/21/2020 0507   K 4.2 03/21/2020 0507   CL 102 03/21/2020 0507   CO2 25 03/21/2020 0507   GLUCOSE 257 (H) 03/21/2020 0507   BUN 14 03/21/2020 0507   CREATININE 0.85 03/21/2020 0507   CALCIUM 8.8 (L) 03/21/2020 0507   GFRNONAA >60 03/21/2020 0507    INR    Component Value Date/Time   INR 1.2 03/18/2020 1835     Intake/Output Summary (Last 24 hours) at 03/21/2020 1050 Last data filed at 03/21/2020 0645 Gross per 24 hour  Intake 120 ml  Output --  Net 120 ml   Summary:  Right Carotid: Velocities in the right ICA are consistent with a 1-39%  stenosis,         upper range of scale by peak systolic velocities and plaque         morphology.   Left Carotid: Velocities in the left ICA are consistent with a 1-39%  stenosis,        upper range of scale by peak systolic velocities and plaque        morphology.   Vertebrals: Bilateral vertebral arteries demonstrate antegrade flow.  Subclavians: Normal flow hemodynamics were seen in bilateral subclavian        arteries.   Assessment/plan:  86 y.o.  female is here with syncopal event with concomitant hypoglycemia.  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  Carotid duplex demonstrates vertebral artery antegrade flow bilaterally with normal flow hemodynamics in her subclavian artery she has palpable radial artery pulses.  I do believe that subclavian artery steal syndrome very unlikely in this patient.  She needs to be on aspirin and statin.  She would need follow-up with [place] in 5 years for her carotid stenosis.   Zameria Vogl C. Randie Heinz, MD [facility] Office: [phone] Pager: [phone]  03/21/2020 10:50 AM
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
## 2020-03-21 NOTE — Progress Notes (Signed)
Pt has been discharged via wheelchair to home. Family member is at bedside. AVS documentation has been given and reviewed. Pt has all tele and iv disconnected. All belongings are sent with pt. Pt is sent in good spirits. Marland Kitchen

## 2020-03-21 NOTE — Progress Notes (Signed)
EEG complete - results pending 

## 2020-03-21 NOTE — Procedures (Addendum)
/  Patient Name: Cynthia Dunlap  MRN: [id number]  Epilepsy Attending: Charlsie Quest  Referring Physician/Provider: Dr Albertine Grates Date:  03/21/2020 Duration: 24.01 mins  Patient history: 86yo F with syncope. EE to evaluate for seizure  Level of alertness: Awake  AEDs during EEG study: None  Technical aspects: This EEG study was done with scalp electrodes positioned according to the 10-20 International system of electrode placement. Electrical activity was acquired at a sampling rate of 500Hz  and reviewed with a high frequency filter of 70Hz  and a low frequency filter of 1Hz . EEG data were recorded continuously and digitally stored.  Description: The posterior dominant rhythm consists of 7.5 Hz activity of moderate voltage (25-35 uV) seen predominantly in posterior head regions, symmetric and reactive to eye opening and eye closing. EEG showed continuous generalized 3 to 6 Hz theta-delta slowing. Hyperventilation and photic stimulation were not performed.     ABNORMALITY -Continuous slow, generalized  IMPRESSION: This study is suggestive of mild diffuse encephalopathy, nonspecific etiology. No seizures or epileptiform discharges were seen throughout the recording.  [name] 
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
## 2020-03-22 ENCOUNTER — Other Ambulatory Visit: Payer: Self-pay | Admitting: Cardiology

## 2020-03-22 ENCOUNTER — Encounter: Payer: Self-pay | Admitting: *Deleted

## 2020-03-22 DIAGNOSIS — R55 Syncope and collapse: Secondary | ICD-10-CM

## 2020-03-22 NOTE — Progress Notes (Signed)
Patient ID: Cynthia Dunlap, female   DOB: [birth year], 86 y.o.   MRN: [id number] Patient enrolled for [employer] to ship a 30 day cardiac event monitor to her home. Letter with instructions mailed to patient.

## 2020-03-30 ENCOUNTER — Telehealth: Payer: Self-pay | Admitting: *Deleted

## 2020-03-30 ENCOUNTER — Ambulatory Visit: Payer: Medicare PPO | Admitting: Neurology

## 2020-03-30 ENCOUNTER — Other Ambulatory Visit: Payer: Self-pay

## 2020-03-30 ENCOUNTER — Telehealth: Payer: Self-pay | Admitting: Neurology

## 2020-03-30 ENCOUNTER — Encounter: Payer: Self-pay | Admitting: Neurology

## 2020-03-30 VITALS — BP 120/69 | HR 76 | Ht 69.0 in | Wt 176.0 lb

## 2020-03-30 DIAGNOSIS — R413 Other amnesia: Secondary | ICD-10-CM

## 2020-03-30 DIAGNOSIS — R419 Unspecified symptoms and signs involving cognitive functions and awareness: Secondary | ICD-10-CM | POA: Diagnosis not present

## 2020-03-30 MED ORDER — LEVETIRACETAM 500 MG PO TABS: 500.0000 mg | ORAL_TABLET | Freq: Two times a day (BID) | ORAL | 11 refills | Status: DC

## 2020-03-30 NOTE — Telephone Encounter (Signed)
I have ordered cardiac event monitoring 30 days.
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
## 2020-03-30 NOTE — Telephone Encounter (Signed)
Patient had been enrolled with [employer] to ship a 30 day cardiac event monitor back on 03/22/2020.  [employer] unable to contact patient to verify address because listed phone was no longer in service.   Phone numbers corrected in demographics. [employer] contacted with new phone numbers and confirmed shipping address.  Emergence contact , daughter in law, Osvaldo Human [phone]

## 2020-03-31 LAB — HGB A1C W/O EAG: Hgb A1c MFr Bld: 6.3 % — ABNORMAL HIGH (ref 4.8–5.6)

## 2020-03-31 LAB — THYROID PANEL WITH TSH
Free Thyroxine Index: 2.5 (ref 1.2–4.9)
T3 Uptake Ratio: 28 % (ref 24–39)
T4, Total: 9.1 ug/dL (ref 4.5–12.0)
TSH: 1.87 u[IU]/mL (ref 0.450–4.500)

## 2020-04-01 NOTE — Telephone Encounter (Signed)
Noted patient's daughter has been contacted.

## 2020-04-07 ENCOUNTER — Other Ambulatory Visit: Payer: Medicare PPO

## 2020-04-09 ENCOUNTER — Encounter (INDEPENDENT_AMBULATORY_CARE_PROVIDER_SITE_OTHER): Payer: Medicare PPO

## 2020-04-09 DIAGNOSIS — R55 Syncope and collapse: Secondary | ICD-10-CM

## 2020-04-19 ENCOUNTER — Ambulatory Visit: Payer: Medicare PPO | Admitting: Neurology

## 2020-04-19 DIAGNOSIS — R410 Disorientation, unspecified: Secondary | ICD-10-CM

## 2020-04-19 DIAGNOSIS — R419 Unspecified symptoms and signs involving cognitive functions and awareness: Secondary | ICD-10-CM

## 2020-04-21 NOTE — Procedures (Signed)
   HISTORY: 86 years old female presented with passing out spells.   TECHNIQUE:  This is a routine 16 channel EEG recording with one channel devoted to a limited EKG recording.  It was performed during wakefulness, drowsiness and asleep.  Hyperventilation and photic stimulation were performed as activating procedures.  There are minimum muscle and movement artifact noted.  Upon maximum arousal, posterior dominant waking rhythm consistent of mildly dysrhythmic activities, with frequency of 6-7Hz . Activities are symmetric over the bilateral posterior derivations and attenuated with eye opening.  Hyperventilation produced mild/moderate buildup with higher amplitude and the slower activities noted.  Photic stimulation did not alter the tracing.  During EEG recording, patient developed drowsiness and no deeper stage of sleep was achieved. During EEG recording, there was no epileptiform discharge noted.  EKG demonstrate sinus rhythm, with heart rate of 56 mph.  CONCLUSION: This is a mildly abnormal  EEG.  There is evidence of mildly slow background activity, indicating mild bi-hemisphere malfunction, common etiology are metabolic and toxic.  Levert Feinstein, M.D. Ph.D.  [facility] [address] [place], [place] [address] Phone: [phone] Fax:      [phone]
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
## 2020-05-05 ENCOUNTER — Ambulatory Visit: Payer: Medicare PPO | Admitting: Cardiology
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
## 2020-05-20 ENCOUNTER — Other Ambulatory Visit: Payer: Self-pay

## 2020-05-20 ENCOUNTER — Ambulatory Visit: Payer: Medicare PPO | Admitting: Cardiology

## 2020-05-20 VITALS — BP 128/60 | HR 62 | Ht 69.0 in | Wt 168.0 lb

## 2020-05-20 DIAGNOSIS — I2510 Atherosclerotic heart disease of native coronary artery without angina pectoris: Secondary | ICD-10-CM

## 2020-05-20 DIAGNOSIS — E119 Type 2 diabetes mellitus without complications: Secondary | ICD-10-CM

## 2020-05-20 DIAGNOSIS — E782 Mixed hyperlipidemia: Secondary | ICD-10-CM | POA: Diagnosis not present

## 2020-05-20 DIAGNOSIS — Z794 Long term (current) use of insulin: Secondary | ICD-10-CM

## 2020-05-20 DIAGNOSIS — R55 Syncope and collapse: Secondary | ICD-10-CM | POA: Diagnosis not present

## 2020-05-20 DIAGNOSIS — I10 Essential (primary) hypertension: Secondary | ICD-10-CM | POA: Diagnosis not present

## 2020-05-20 NOTE — Patient Instructions (Signed)
Medication Instructions:  Your physician recommends that you continue on your current medications as directed. Please refer to the Current Medication list given to you today.  *If you need a refill on your cardiac medications before your next appointment, please call your pharmacy*   Lab Work: none If you have labs (blood work) drawn today and your tests are completely normal, you will receive your results only by: . MyChart Message (if you have MyChart) OR . A paper copy in the mail If you have any lab test that is abnormal or we need to change your treatment, we will call you to review the results.   Testing/Procedures: none   Follow-Up: At [facility], you and your health needs are our priority.  As part of our continuing mission to provide you with exceptional heart care, we have created designated Provider Care Teams.  These Care Teams include your primary Cardiologist (physician) and Advanced Practice Providers (APPs -  Physician Assistants and Nurse Practitioners) who all work together to provide you with the care you need, when you need it.  We recommend signing up for the patient portal called "MyChart".  Sign up information is provided on this After Visit Summary.  MyChart is used to connect with patients for Virtual Visits (Telemedicine).  Patients are able to view lab/test results, encounter notes, upcoming appointments, etc.  Non-urgent messages can be sent to your provider as well.   To learn more about what you can do with MyChart, go to [email or web address].com.    Your next appointment:   12 month(s)  The format for your next appointment:   In Person  Provider:   You may see Heather E Pemberton, MD or one of the following Advanced Practice Providers on your designated Care Team:    Scott Weaver, PA-C  Vin Bhagat, PA-C    Other Instructions   

## 2020-06-30 ENCOUNTER — Ambulatory Visit: Payer: Medicare PPO | Admitting: Neurology

## 2020-09-07 ENCOUNTER — Encounter: Payer: Self-pay | Admitting: Neurology

## 2020-09-07 ENCOUNTER — Ambulatory Visit: Payer: Medicare PPO | Admitting: Neurology

## 2020-09-07 VITALS — BP 162/80 | HR 64 | Ht 69.0 in | Wt 164.5 lb

## 2020-09-07 DIAGNOSIS — R413 Other amnesia: Secondary | ICD-10-CM

## 2020-09-07 DIAGNOSIS — R404 Transient alteration of awareness: Secondary | ICD-10-CM | POA: Diagnosis not present

## 2020-09-07 DIAGNOSIS — M545 Low back pain, unspecified: Secondary | ICD-10-CM | POA: Diagnosis not present

## 2020-09-07 DIAGNOSIS — G8929 Other chronic pain: Secondary | ICD-10-CM

## 2020-09-07 DIAGNOSIS — R269 Unspecified abnormalities of gait and mobility: Secondary | ICD-10-CM | POA: Insufficient documentation

## 2020-09-07 MED ORDER — LEVETIRACETAM 500 MG PO TABS: 500.0000 mg | ORAL_TABLET | Freq: Two times a day (BID) | ORAL | 4 refills | Status: DC
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
## 2020-09-08 ENCOUNTER — Telehealth: Payer: Self-pay | Admitting: Neurology

## 2020-09-08 NOTE — Telephone Encounter (Signed)
Cynthia Dunlap: [id number] (exp. 09/08/20 to 10/08/20) order sent to GI. They will reach out to the patient to schedule.

## 2020-09-09 NOTE — Telephone Encounter (Signed)
[name] on the patient DPR called stating they wanted the patient to go to [facility] imaging because it is closer to their home.  I have patient scheduled at [facility] for 09/21/20 to arrive at 11:45 AM. I called [name] and informed her of the appointment information. She stated that she wanted the appointment before Monday 09/13/20. I informed her that [facility] didn't have anything sooner than 09/21/20. I did give her their number of [phone] to give them a call if they have any cancellations.

## 2020-09-15 ENCOUNTER — Telehealth (HOSPITAL_COMMUNITY): Payer: Self-pay

## 2020-09-15 ENCOUNTER — Other Ambulatory Visit (HOSPITAL_COMMUNITY): Payer: Self-pay | Admitting: Interventional Radiology

## 2020-09-15 ENCOUNTER — Ambulatory Visit
Admission: RE | Admit: 2020-09-15 | Discharge: 2020-09-15 | Disposition: A | Discharge status: Home / Self Care | Payer: Self-pay | Source: Ambulatory Visit | Attending: Interventional Radiology | Admitting: Interventional Radiology

## 2020-09-15 DIAGNOSIS — R52 Pain, unspecified: Secondary | ICD-10-CM

## 2020-09-15 NOTE — Telephone Encounter (Signed)
Ok per [employer] for L3 KP. AW

## 2020-09-17 ENCOUNTER — Telehealth: Payer: Self-pay | Admitting: Neurology

## 2020-09-17 NOTE — Telephone Encounter (Addendum)
Clydie Braun a nurse from [employer] called wanting to know if the pt can get a Home Health Evaluation for a RN to go to her home and help her out with her medications. Clydie Braun states that it seems that the pt is taking her medications incorrectly. Please advise. [phone] ext. [phone]

## 2020-09-20 ENCOUNTER — Other Ambulatory Visit: Payer: Self-pay | Admitting: *Deleted

## 2020-09-20 DIAGNOSIS — R413 Other amnesia: Secondary | ICD-10-CM

## 2020-09-20 DIAGNOSIS — R404 Transient alteration of awareness: Secondary | ICD-10-CM

## 2020-09-20 NOTE — Telephone Encounter (Signed)
Per vo by Dr. Terrace Arabia, okay to place home health orders  for nursing/medication management. I returned the call to her RN case manager at [facility] and left a message on her secure voicemail that the referral has been placed.

## 2020-09-21 ENCOUNTER — Other Ambulatory Visit (HOSPITAL_COMMUNITY): Payer: Self-pay | Admitting: Interventional Radiology

## 2020-09-21 ENCOUNTER — Telehealth (HOSPITAL_COMMUNITY): Payer: Self-pay

## 2020-09-21 DIAGNOSIS — S32030A Wedge compression fracture of third lumbar vertebra, initial encounter for closed fracture: Secondary | ICD-10-CM

## 2020-09-21 NOTE — Telephone Encounter (Signed)
Called to schedule consult, no answer, left vm. AW  

## 2020-09-22 ENCOUNTER — Ambulatory Visit (HOSPITAL_COMMUNITY): Admission: RE | Admit: 2020-09-22 | Payer: Medicare PPO | Source: Ambulatory Visit

## 2020-09-24 ENCOUNTER — Other Ambulatory Visit: Payer: Self-pay

## 2020-09-24 ENCOUNTER — Ambulatory Visit (HOSPITAL_COMMUNITY)
Admission: RE | Admit: 2020-09-24 | Discharge: 2020-09-24 | Disposition: A | Discharge status: Home / Self Care | Payer: Medicare PPO | Source: Ambulatory Visit | Attending: Interventional Radiology | Admitting: Interventional Radiology

## 2020-09-24 DIAGNOSIS — S32030A Wedge compression fracture of third lumbar vertebra, initial encounter for closed fracture: Secondary | ICD-10-CM

## 2020-09-24 MED ORDER — LIDOCAINE HCL 1 % IJ SOLN
INTRAMUSCULAR | Status: AC
  Filled 2020-09-24: qty 20

## 2020-09-27 ENCOUNTER — Telehealth: Payer: Self-pay | Admitting: Neurology

## 2020-09-27 NOTE — Telephone Encounter (Signed)
Please call patient, MRI of lumbar spine on September 10, 2020 from [facility] in [place] showed acute compression fracture of L3 vertebral body, mild spinal stenosis at L2-3, mild left foraminal stenosis at L5-S1   I failed to reach patient by multiple phone contact, MRI of lumbar spine showed compression fracture of L3 vertebral body,  Please check on her if she still has significant pain, if she has, will refer her to orthopedic surgeon for evaluation,   Please fax the report to her primary care physician/referring physician,  Also scanned MRI lumbar spine report into our system

## 2020-09-28 HISTORY — PX: IR RADIOLOGIST EVAL & MGMT: IMG5224

## 2020-09-28 NOTE — Telephone Encounter (Signed)
I spoke to Osvaldo Human (daughter-in-law on DPR) who is her primary caregiver. She was provided with the results below. States her PCP is aware of the findings. She has already been evaluated by Dr. Ethelene Hal at [facility] and referred to Dr. Julieanne Cotton for a vertebroplasty. They are waiting for insurance to approve the procedure.

## 2020-09-29 ENCOUNTER — Other Ambulatory Visit (HOSPITAL_COMMUNITY): Payer: Self-pay | Admitting: Interventional Radiology

## 2020-09-29 DIAGNOSIS — S32030A Wedge compression fracture of third lumbar vertebra, initial encounter for closed fracture: Secondary | ICD-10-CM

## 2020-09-30 ENCOUNTER — Other Ambulatory Visit: Payer: Self-pay | Admitting: Radiology

## 2020-10-01 ENCOUNTER — Ambulatory Visit (HOSPITAL_COMMUNITY): Payer: Medicare PPO

## 2020-10-04 ENCOUNTER — Ambulatory Visit (HOSPITAL_COMMUNITY)
Admission: RE | Admit: 2020-10-04 | Discharge: 2020-10-04 | Disposition: A | Discharge status: Home / Self Care | Payer: Medicare PPO | Source: Ambulatory Visit | Attending: Interventional Radiology | Admitting: Interventional Radiology

## 2020-10-04 ENCOUNTER — Telehealth: Payer: Self-pay | Admitting: Neurology

## 2020-10-04 ENCOUNTER — Encounter (HOSPITAL_COMMUNITY): Payer: Self-pay

## 2020-10-04 ENCOUNTER — Other Ambulatory Visit: Payer: Self-pay

## 2020-10-04 DIAGNOSIS — Z79899 Other long term (current) drug therapy: Secondary | ICD-10-CM | POA: Insufficient documentation

## 2020-10-04 DIAGNOSIS — Z888 Allergy status to other drugs, medicaments and biological substances status: Secondary | ICD-10-CM | POA: Insufficient documentation

## 2020-10-04 DIAGNOSIS — M545 Low back pain, unspecified: Secondary | ICD-10-CM | POA: Insufficient documentation

## 2020-10-04 DIAGNOSIS — Z794 Long term (current) use of insulin: Secondary | ICD-10-CM | POA: Diagnosis not present

## 2020-10-04 DIAGNOSIS — S32000S Wedge compression fracture of unspecified lumbar vertebra, sequela: Secondary | ICD-10-CM | POA: Diagnosis not present

## 2020-10-04 DIAGNOSIS — S32030A Wedge compression fracture of third lumbar vertebra, initial encounter for closed fracture: Secondary | ICD-10-CM | POA: Insufficient documentation

## 2020-10-04 DIAGNOSIS — Z7982 Long term (current) use of aspirin: Secondary | ICD-10-CM | POA: Diagnosis not present

## 2020-10-04 DIAGNOSIS — Z885 Allergy status to narcotic agent status: Secondary | ICD-10-CM | POA: Insufficient documentation

## 2020-10-04 DIAGNOSIS — W19XXXS Unspecified fall, sequela: Secondary | ICD-10-CM | POA: Diagnosis not present

## 2020-10-04 HISTORY — PX: IR KYPHO LUMBAR INC FX REDUCE BONE BX UNI/BIL CANNULATION INC/IMAGING: IMG5519

## 2020-10-04 LAB — CBC
HCT: 43.8 % (ref 36.0–46.0)
Hemoglobin: 14 g/dL (ref 12.0–15.0)
MCH: 32.4 pg (ref 26.0–34.0)
MCHC: 32 g/dL (ref 30.0–36.0)
MCV: 101.4 fL — ABNORMAL HIGH (ref 80.0–100.0)
Platelets: 159 10*3/uL (ref 150–400)
RBC: 4.32 MIL/uL (ref 3.87–5.11)
RDW: 13.1 % (ref 11.5–15.5)
WBC: 8.1 10*3/uL (ref 4.0–10.5)
nRBC: 0 % (ref 0.0–0.2)

## 2020-10-04 LAB — BASIC METABOLIC PANEL
Anion gap: 8 (ref 5–15)
BUN: 13 mg/dL (ref 8–23)
CO2: 26 mmol/L (ref 22–32)
Calcium: 9.1 mg/dL (ref 8.9–10.3)
Chloride: 107 mmol/L (ref 98–111)
Creatinine, Ser: 0.78 mg/dL (ref 0.44–1.00)
GFR, Estimated: >60 mL/min (ref >60)
Glucose, Bld: 100 mg/dL — ABNORMAL HIGH (ref 70–99)
Potassium: 3.7 mmol/L (ref 3.5–5.1)
Sodium: 141 mmol/L (ref 135–145)

## 2020-10-04 LAB — PROTIME-INR
INR: 1.1 (ref 0.8–1.2)
Prothrombin Time: 14.2 seconds (ref 11.4–15.2)

## 2020-10-04 LAB — GLUCOSE, CAPILLARY: Glucose-Capillary: 142 mg/dL — ABNORMAL HIGH (ref 70–99)

## 2020-10-04 MED ORDER — MIDAZOLAM HCL 2 MG/2ML IJ SOLN
INTRAMUSCULAR | Status: AC
  Filled 2020-10-04: qty 2

## 2020-10-04 MED ORDER — BUPIVACAINE HCL 0.25 % IJ SOLN
INTRAMUSCULAR | Status: AC | PRN
  Administered 2020-10-04: 15 mL

## 2020-10-04 MED ORDER — FENTANYL CITRATE (PF) 100 MCG/2ML IJ SOLN
INTRAMUSCULAR | Status: AC
  Filled 2020-10-04: qty 2

## 2020-10-04 MED ORDER — IOHEXOL 300 MG/ML  SOLN
50.0000 mL | Freq: Once | INTRAMUSCULAR | Status: AC | PRN
  Administered 2020-10-04: 10 mL

## 2020-10-04 MED ORDER — SODIUM CHLORIDE 0.9 % IV SOLN: INTRAVENOUS | Status: AC

## 2020-10-04 MED ORDER — VANCOMYCIN HCL IN DEXTROSE 1-5 GM/200ML-% IV SOLN: 1000.0000 mg | INTRAVENOUS | Status: AC

## 2020-10-04 MED ORDER — FENTANYL CITRATE (PF) 100 MCG/2ML IJ SOLN
INTRAMUSCULAR | Status: AC | PRN
  Administered 2020-10-04 (×3): 25 ug via INTRAVENOUS

## 2020-10-04 MED ORDER — TOBRAMYCIN SULFATE 1.2 G IJ SOLR
INTRAMUSCULAR | Status: AC
  Administered 2020-10-04: 0.1 mg
  Filled 2020-10-04: qty 1.2

## 2020-10-04 MED ORDER — VANCOMYCIN HCL IN DEXTROSE 1-5 GM/200ML-% IV SOLN
INTRAVENOUS | Status: AC
  Administered 2020-10-04: 1000 mg via INTRAVENOUS
  Filled 2020-10-04: qty 200

## 2020-10-04 MED ORDER — SODIUM CHLORIDE 0.9 % IV SOLN: INTRAVENOUS | Status: DC

## 2020-10-04 MED ORDER — BUPIVACAINE HCL (PF) 0.5 % IJ SOLN
INTRAMUSCULAR | Status: AC
  Filled 2020-10-04: qty 30

## 2020-10-04 MED ORDER — MIDAZOLAM HCL 2 MG/2ML IJ SOLN
INTRAMUSCULAR | Status: AC | PRN
  Administered 2020-10-04 (×3): 1 mg via INTRAVENOUS

## 2020-10-04 NOTE — Progress Notes (Signed)
Discharge instructions reviewed with pt and her daughter in law (via telephone) both voice understanding.  

## 2020-10-04 NOTE — Procedures (Signed)
S/P L3 KP S.Cale Decarolis MD

## 2020-10-04 NOTE — Progress Notes (Signed)
This RN received call from Lab regarding patient's CBC clotted. Dr. Corliss Skains stated to proceed without lab being redrawn.

## 2020-10-04 NOTE — H&P (Signed)
Chief Complaint: Patient was seen in consultation today for Lumbar 3 Kyphoplasty at the request of Dr Brayton El   Supervising Physician: Julieanne Cotton  Patient Status: [facility] - Out-pt  History of Present Illness: Cynthia Dunlap is a 87 y.o. female   Larey Seat at home ~1 mo ago. Continued pain Unable to continue lifestyle Pain meds no real help Referred to Dr Corliss Skains and seen in consultation 09/24/20  Outside imaging reviewed with Dr Corliss Skains Acute L3 fracture noted; tender to palpation T8-T9 less tender on exam  Scheduled for Lumbar 3 KP    Past Medical History:  Diagnosis Date  . Bulging lumbar disc   . Chest pain   . Diabetes mellitus without complication (HCC)   . GERD (gastroesophageal reflux disease)   . Hypercholesterolemia   . Hyperlipemia   . Hypertension   . HZ (herpes zoster)   . Myocardial ischemia   . Osteoarthrosis   . PAD (peripheral artery disease) (HCC)   . Parkinson disease (HCC)   . Plantar fasciitis   . Polyneuropathy in diabetes (HCC)   . Pulmonary hypertension (HCC)   . Retinopathy, background, diabetic (HCC)   . Rheumatism   . Seasonal allergies   . Urinary urgency     Past Surgical History:  Procedure Laterality Date  . APPENDECTOMY    . BACK SURGERY    . CATARACT EXTRACTION    . CHOLECYSTECTOMY    . IR RADIOLOGIST EVAL & MGMT  09/28/2020  . SHOULDER SURGERY Bilateral   . TUBAL LIGATION      Allergies: Codeine, Darvon [propoxyphene], Gabapentin, Levofloxacin, and Lovastatin  Medications: Prior to Admission medications   Medication Sig Start Date End Date Taking? Authorizing Provider  amitriptyline (ELAVIL) 25 MG tablet Take 25 mg by mouth at bedtime. 01/20/20  Yes [provider]  amLODipine (NORVASC) 10 MG tablet Take 10 mg by mouth daily. 03/30/15  Yes [provider]  aspirin 81 MG EC tablet Take 1 tablet (81 mg total) by mouth daily. Swallow whole.  Hold for a week, resume when ok with pcp 03/21/20  Yes  Albertine Grates, MD  atorvastatin (LIPITOR) 80 MG tablet Take 1 tablet (80 mg total) by mouth daily. Hold for a week, pcp to repeat liver function test and ck level, resume lipitor when ok with your primary care physician. 03/21/20  Yes Albertine Grates, MD  baclofen (LIORESAL) 10 MG tablet Take 10 mg by mouth 2 (two) times daily as needed for muscle spasms.   Yes [provider]  Carboxymethylcellul-Glycerin (LUBRICATING EYE DROPS OP) Place 1 drop into both eyes daily as needed (dry eyes).   Yes [provider]  Cholecalciferol (VITAMIN D) 125 MCG (5000 UT) CAPS Take 5,000 Units by mouth daily.   Yes [provider]  CREON 24000-76000 units CPEP Take 2 capsules by mouth 2 (two) times daily with a meal. 10/09/19  Yes [provider]  ezetimibe (ZETIA) 10 MG tablet Take 10 mg by mouth daily. 03/02/20  Yes [provider]  insulin aspart protamine- aspart (NOVOLOG MIX 70/30) (70-30) 100 UNIT/ML injection Inject 10-20 Units into the skin 2 (two) times daily with a meal.   Yes [provider]  levETIRAcetam (KEPPRA) 500 MG tablet Take 1 tablet (500 mg total) by mouth 2 (two) times daily. 09/07/20  Yes Levert Feinstein, MD  levocetirizine (XYZAL) 5 MG tablet Take 5 mg by mouth every evening.   Yes [provider]  losartan (COZAAR) 100 MG tablet Take  100 mg by mouth daily.   Yes [provider]  metoprolol succinate (TOPROL-XL) 50 MG 24 hr tablet Take 50 mg by mouth daily. 01/20/20  Yes [provider]  pantoprazole (PROTONIX) 40 MG tablet Take 40 mg by mouth daily. 03/25/20  Yes [provider]  acetaminophen (TYLENOL) 500 MG tablet Take 1,000 mg by mouth every 6 (six) hours as needed for moderate pain or headache.    [provider]  ibuprofen (ADVIL) 200 MG tablet Take 400 mg by mouth every 6 (six) hours as needed for moderate pain or headache.    [provider]     Family History  Problem Relation Age of Onset  .  Other Mother        unsure of medical history  . Other Father        unsure of medical history  . Other Son        benign brain tumor removed  . Suicidality Son     Social History   Socioeconomic History  . Marital status: Widowed    Spouse name: Not on file  . Number of children: 2  . Years of education: 66  . Highest education level: High school graduate  Occupational History  . Occupation: Retired  Tobacco Use  . Smoking status: Never Smoker  . Smokeless tobacco: Never Used  Vaping Use  . Vaping Use: Never used  Substance and Sexual Activity  . Alcohol use: Not Currently  . Drug use: Never  . Sexual activity: Not on file  Other Topics Concern  . Not on file  Social History Narrative   Lives with her son.    Her daughter-in-law lives next door.   Right-handed.   Caffeine use: one cup coffee per day.   Social Determinants of Health   Financial Resource Strain: Not on file  Food Insecurity: Not on file  Transportation Needs: Not on file  Physical Activity: Not on file  Stress: Not on file  Social Connections: Not on file     Review of Systems: A 12 point ROS discussed and pertinent positives are indicated in the HPI above.  All other systems are negative.  Review of Systems  Constitutional: Positive for activity change. Negative for fever and unexpected weight change.  Respiratory: Negative for cough and shortness of breath.   Cardiovascular: Negative for chest pain.  Gastrointestinal: Negative for abdominal pain.  Musculoskeletal: Positive for back pain.  Neurological: Negative for weakness.  Psychiatric/Behavioral: Positive for decreased concentration. Negative for behavioral problems and confusion.    Vital Signs: BP (!) 142/62   Pulse (!) 56   Temp 97.9 F (36.6 C) (Oral)   Resp 16   Ht  (1.753 m)   Wt 160 lb (72.6 kg)   SpO2 97%   BMI 23.63 kg/m   Physical Exam Vitals reviewed.  HENT:     Mouth/Throat:     Mouth: Mucous membranes  are moist.  Cardiovascular:     Rate and Rhythm: Normal rate and regular rhythm.     Heart sounds: Normal heart sounds.  Pulmonary:     Effort: Pulmonary effort is normal.     Breath sounds: Normal breath sounds.  Abdominal:     Palpations: Abdomen is soft.  Musculoskeletal:        General: Normal range of motion.     Comments: Low back pain  Skin:    General: Skin is warm.  Neurological:     Mental Status: She  is alert and oriented to person, place, and time.  Psychiatric:        Behavior: Behavior normal.     Comments: Daughter in law at bedside All agree to move ahead     Imaging: IR Radiologist Eval & Mgmt  Result Date: 09/28/2020 EXAM: NEW PATIENT OFFICE VISIT CHIEF COMPLAINT: Severe low back pain. Current Pain Level: 1-10 HISTORY OF PRESENT ILLNESS: The patient is an 87 year old lady who had been referred for evaluation and treatment of painful compression fracture at L3. The patient is accompanied by her son. Briefly, the patient reports first experiencing this pain following a fall approximately 4 weeks ago. Patient apparently fell to 1 side and on her buttocks. This was followed by a progressively worsening pain to a ten out of 10. The pain is associated with spasms in the midthoracic, and also the lower lumbar regions. The patient reports the pain to be worse with ambulation or turning. The patient reports radiation of the pain to the left lateral aspect of her thigh and the left ankle. There is no associated paresthesias or weakness associated with this, however. The patient reports disturbance with her sleeping pattern due to the near constant pain. She reports her appetite to be fairly intact with no loss of weight. She denies any recent chills, fever or rigors. She denies any symptoms of dysuria, frequency or hematuria. She denies any symptoms of chest pain, shortness of breath or cough or wheezing. Denies any difficulty swallowing. No reflux, abdominal pain, nausea or  vomiting, constipation or diarrhea. * Codeine: * Nausea And Vomiting: Other reaction(s): Vomiting . * Codeine: Darvon Propoxyphene * Nausea And Vomiting: * Codeine: * Nausea And Vomiting: Unknown reaction . * Codeine: Gabapentin * Nausea And Vomiting: * Codeine: * Nausea And Vomiting: Other reaction(s): Dizziness . * Codeine: Levofloxacin * Nausea And Vomiting: . * Codeine: Lovastatin * Nausea And Vomiting: Other (See Comments) * Codeine: * Nausea And Vomiting: Other reaction(s): Hives . * Codeine: Penicillins * Nausea And Vomiting: * Codeine: * Nausea And Vomiting: Hives - childhood reaction. Per pt, she has taken as an adult without any issues. HOME MEDICATIONS: Current Outpatient Medications Medication * : Sig * : Dispense * : Refill . * : amitriptyline (ELAVIL) 25 MG tablet * : Take 25 mg by mouth at bedtime. * : * : . * : amLODipine (NORVASC) 10 MG tablet * : Take 1 tablet by mouth daily. * : * : . * : aspirin 81 MG EC tablet * : Take 1 tablet (81 mg total) by mouth daily. Swallow whole. Hold for a week, resume when ok with pcp * : 30 tablet * : 12 . * : atorvastatin (LIPITOR) 80 MG tablet * : Take 1 tablet (80 mg total) by mouth daily. Hold for a week, pcp to repeat liver function test and ck level, resume lipitor when ok with your primary care physician. * : * : . * : Cholecalciferol (VITAMIN D) 125 MCG (5000 UT) CAPS * : Take 1 capsule by mouth daily. * : * : . * : CREON 24000-76000 units CPEP * : Take 3 capsules by mouth 2 (two) times daily. * : * : . * : ezetimibe (ZETIA) 10 MG tablet * : Take 10 mg by mouth daily. * : * : . * : insulin aspart protamine- aspart (NOVOLOG MIX 70/30) (70-30) 100 UNIT/ML injection * : Inject 40 Units into the skin 2 (two) times daily with a meal. * : * : . * :  levETIRAcetam (KEPPRA) 500 MG tablet * : Take 1 tablet (500 mg total) by mouth 2 (two) times daily. * : 60 tablet * : 11 . * : levocetirizine (XYZAL) 5 MG tablet * : Take 5 mg by mouth every evening. * : * : . * :  losartan (COZAAR) 100 MG tablet * : Take 100 mg by mouth daily. * : * : . * : metoprolol succinate (TOPROL-XL) 50 MG 24 hr tablet * : Take 50 mg by mouth daily. * : * : . * : pantoprazole (PROTONIX) 40 MG tablet * : Take 1 tablet by mouth daily. * : * : PAST MEDICAL HISTORY: Past Medical History: Diagnosis * : Date . * : Bulging lumbar disc * : . * : Chest pain * : . * : Diabetes mellitus without complication (HCC) * : . * : GERD (gastroesophageal reflux disease) * : . * : Hypercholesterolemia * : . * : Hyperlipemia * : . * : Hypertension * : . * : HZ (herpes zoster) * : . * : Myocardial ischemia * : . * : Osteoarthrosis * : . * : PAD (peripheral artery disease) (HCC) * : . * : Parkinson disease (HCC) * : . * : Plantar fasciitis * : . * : Polyneuropathy in diabetes (HCC) * : . * : Pulmonary hypertension (HCC) * : . * : Retinopathy, background, diabetic (HCC) * : . * : Rheumatism * : . * : Seasonal allergies * : . * : Urinary urgency * : PAST SURGICAL HISTORY: Past Surgical History: Procedure * : Laterality * : Date . * : APPENDECTOMY * : * : . * : BACK SURGERY * : * : . * : CATARACT EXTRACTION * : * : . * : CHOLECYSTECTOMY * : * : . * : SHOULDER SURGERY * : Bilateral * : . * : TUBAL LIGATION * : * : FAMILY HISTORY: Family History Problem * : Relation * : Age of Onset . * : Other * : Mother * : * :     unsure of medical history . * : Other * : Father * : * :     unsure of medical history . * : Other * : Son * : * :     benign brain tumor removed . * : Suicidality * : Son * : SOCIAL HISTORY: Socioeconomic History . * : Marital status: * : Widowed * : * : Spouse name: * : Not on file . * : Number of children: * : 2 . * : Years of education: * : 12 . * : Highest education level: * : High school graduate Occupational History . * : Occupation: * : Retired Tobacco Use . * : Smoking status: * : Never Smoker . * : Smokeless tobacco: * : Never Used Substance and Sexual Activity . * : Alcohol use: * : Not Currently . * : Drug  use: * : Not on file . * : Sexual activity: * : Not on file Other Topics * : Concern . * : Not on file Social History Narrative * : Lives with her son. * : Her daughter-in-law lives next door. * : Right-handed. * : Caffeine use: one cup coffee per day. Food Insecurity: Not on file Transportation Needs: Not on file Physical Activity: Not on file Stress: Not on file Social Connections: Not on file Intimate Partner Violence: Not on file REVIEW OF SYSTEMS: As above. PHYSICAL  EXAMINATION: Patient in moderate distress on account of her back pain. Her affect was appropriate to the situation. Responses appropriate.  Normal eye contact. Exquisitely tenderness in the upper lumbar L3 region, less so at the T8-T9 region. ASSESSMENT AND PLAN: Patient's MRI findings were reviewed with the patient and her son. Brought to their attention was the L3 vertebral body compression fracture associated with minimal retropulsion. Given her severe debilitating pain, the patient was felt to be appropriate for vertebral body augmentation with kyphoplasty for pain relief, and also prevent further collapse of the L3. The procedure, the benefits, the risks and alternatives were reviewed with both of them. Questions were answered to their satisfaction. Patient is agreeable to proceed with vertebral body augmentation with a kyphoplasty/vertebroplasty. This will be scheduled at the earliest possible. Electronically Signed   By: Julieanne CottonSanjeev  Deveshwar M.D.   On: 09/27/2020 11:39    Labs:  CBC: Recent Labs    03/18/20 1403 03/18/20 1414 03/19/20 0222 03/20/20 0248 03/21/20 0507  WBC 10.2  --  13.4* 7.8 5.6  HGB 8.8* 13.3 11.6* 9.7* 9.1*  HCT 28.3* 39.0 36.4 30.0* 28.9*  PLT 117*  --  168 134* 108*    COAGS: Recent Labs    03/18/20 1835 10/04/20 0705  INR 1.2 1.1    BMP: Recent Labs    03/19/20 0222 03/20/20 0248 03/21/20 0507 10/04/20 0705  NA 135 134* 137 141  K 4.8 4.6 4.2 3.7  CL 101 101 102 107  CO2 20* 25 25 26    GLUCOSE 263* 263* 257* 100*  BUN 21 22 14 13   CALCIUM 9.1 8.7* 8.8* 9.1  CREATININE 1.00 0.91 0.85 0.78  GFRNONAA 52* 58* >60 >60    LIVER FUNCTION TESTS: Recent Labs    03/18/20 1403 03/19/20 0222 03/21/20 0507  BILITOT 1.3* 1.2 1.1  AST 87* 79* 65*  ALT 47* 48* 46*  ALKPHOS 56 56 57  PROT 6.1* 6.1* 5.7*  ALBUMIN 3.7 3.6 3.2*    TUMOR MARKERS: No results for input(s): AFPTM, CEA, CA199, CHROMGRNA in the last 8760 hours.  Assessment and Plan:  Low back pain after fall at home 1 mo ago Persistent pain; change in daily activities meds- no help Scheduled today for Lumbar 3 Kyphoplasty Risks and benefits of Lumbar 3 Kyphoplasty were discussed with the patient including, but not limited to education regarding the natural healing process of compression fractures without intervention, bleeding, infection, cement migration which may cause spinal cord damage, paralysis, pulmonary embolism or even death.  This interventional procedure involves the use of X-rays and because of the nature of the planned procedure, it is possible that we will have prolonged use of X-ray fluoroscopy.  Potential radiation risks to you include (but are not limited to) the following: - A slightly elevated risk for cancer  several years later in life. This risk is typically less than 0.5% percent. This risk is low in comparison to the normal incidence of human cancer, which is 33% for women and 50% for men according to the American Cancer Society. - Radiation induced injury can include skin redness, resembling a rash, tissue breakdown / ulcers and hair loss (which can be temporary or permanent).   The likelihood of either of these occurring depends on the difficulty of the procedure and whether you are sensitive to radiation due to previous procedures, disease, or genetic conditions.   IF your procedure requires a prolonged use of radiation, you will be notified and given written instructions for further  action.  It is your responsibility to monitor the irradiated area for the 2 weeks following the procedure and to notify your physician if you are concerned that you have suffered a radiation induced injury.    All of the patient's questions were answered, patient is agreeable to proceed.  Consent signed and in chart.  Thank you for this interesting consult.  I greatly enjoyed meeting MIKERIA VALIN and look forward to participating in their care.  A copy of this report was sent to the requesting provider on this date.  Electronically Signed: Robet Leu, PA-C 10/04/2020, 8:00 AM   I spent a total of  30 Minutes   in face to face in clinical consultation, greater than 50% of which was counseling/coordinating care for L3 KP

## 2020-10-04 NOTE — Discharge Instructions (Addendum)
1.No stooping,or bending or lifting more than 10 lbs for 2 weeks . 2.Use walker to ambulate for 2 weeks. 3.Check with referring MD for biopsy results. 4.No driving for 2 weeks Balloon Kyphoplasty, Care After This sheet gives you information about how to care for yourself after your procedure. Your health care provider may also give you more specific instructions. If you have problems or questions, contact your health care provider. What can I expect after the procedure? After the procedure, it is common to have back pain. Follow these instructions at home: Medicines  Take over-the-counter and prescription medicines only as told by your health care provider.  Ask your health care provider if the medicine prescribed to you: ? Requires you to avoid driving or using machinery. ? Can cause constipation. You may need to take these actions to prevent or treat constipation:  Drink enough fluid to keep your urine pale yellow.  Take over-the-counter or prescription medicines.  Eat foods that are high in fiber, such as beans, whole grains, and fresh fruits and vegetables.  Limit foods that are high in fat and processed sugars, such as fried or sweet foods. Puncture site care  Follow instructions from your health care provider about how to take care of your puncture site. Make sure you: ? Wash your hands with soap and water for at least 20 seconds before and after you change your bandage (dressing). If soap and water are not available, use hand sanitizer. ? Change your dressing as told by your health care provider. ? Leave skin glue or adhesive strips in place. These skin closures may need to be in place for 2 weeks or longer. If adhesive strip edges start to loosen and curl up, you may trim the loose edges. Do not remove adhesive strips completely unless your health care provider tells you to do that.  Check your puncture site every day for signs of infection. Check for: ? Redness, swelling, or  pain. ? Fluid or blood. ? Warmth. ? Pus or a bad smell.  Keep your dressing dry until your health care provider says that it can be removed.   Managing pain, stiffness, and swelling If directed, put ice on the painful area. To do this:  Put ice in a plastic bag.  Place a towel between your skin and the bag.  Leave the ice on for 20 minutes, 2-3 times a day.  Remove the ice if your skin turns bright red. This is very important. If you cannot feel pain, heat, or cold, you have a greater risk of damage to the area.   Activity  Rest your back and avoid intense physical activity for as long as told by your health care provider.  Avoid bending, lifting, or twisting your back for as long as told by your health care provider.  Return to your normal activities as told by your health care provider. Ask your health care provider what activities are safe for you.  Do not lift anything that is heavier than 5 lb (2.2 kg). You may need to avoid heavy lifting for several weeks. General instructions  Do not use any products that contain nicotine or tobacco, such as cigarettes, e-cigarettes, and chewing tobacco. These can delay bone healing. If you need help quitting, ask your health care provider.  If you were given a sedative during the procedure, it can affect you for several hours. Do not drive or operate machinery until your health care provider says that it is safe.  Keep  all follow-up visits. This is important. Contact a health care provider if:  You have a fever or chills.  You have redness, swelling, or pain at the site of your puncture.  You have fluid, blood, or pus coming from the puncture site.  You have pain that gets worse or does not get better with medicine.  You develop numbness or weakness in any part of your body. Get help right away if:  You have chest pain.  You have difficulty breathing.  You have weakness, numbness, or tingling in your legs.  You cannot  control your bladder or bowel movements (incontinence).  You suddenly become weak or numb on one side of your body.  You become very confused.  You have trouble speaking or understanding, or both. These symptoms may represent a serious problem that is an emergency. Do not wait to see if the symptoms will go away. Get medical help right away. Call your local emergency services (911 in the [place] Do not drive yourself to the hospital. Summary  Follow instructions from your health care provider about how to take care of your puncture site.  Take over-the-counter and prescription medicines only as told by your health care provider.  Rest your back and avoid intense physical activity for as long as told by your health care provider.  Contact a health care provider if you have pain that gets worse or does not get better with medicine.  Keep all follow-up visits. This is important. This information is not intended to replace advice given to you by your health care provider. Make sure you discuss any questions you have with your health care provider. Document Revised: 09/17/2019 Document Reviewed: 09/17/2019 Elsevier Patient Education  2021 [employer].

## 2020-10-04 NOTE — Telephone Encounter (Signed)
Noted Thank you   Called and Spoke to Page . At [facility] [phone] - fax [phone].  Referral sent  -  - MY chart message sent .

## 2020-10-04 NOTE — Progress Notes (Signed)
Ambulated to the bathroom to void tol well  

## 2020-10-04 NOTE — Telephone Encounter (Signed)
Home health order placed on 09/17/20. She would prefer services from:  [facility] Address: [address] [place], [place], [place] [address]  Phone: [phone]  She is aware a new order will be sent. They need to schedule the appt through her daughter, Osvaldo Human, at [phone].

## 2020-10-04 NOTE — Telephone Encounter (Signed)
Pt's daughter in law [place] on [place] called needing to speak to the RN regarding getting an order for Home Health Therapy for the pt. Please advise.

## 2020-10-05 LAB — SURGICAL PATHOLOGY

## 2020-10-08 NOTE — Telephone Encounter (Signed)
noted 

## 2020-10-08 NOTE — Telephone Encounter (Signed)
Duwayne Heck from [facility] called wanting to inform us that the pt has refused their services . You can reach her at [phone] If there are any questions.

## 2021-03-14 ENCOUNTER — Telehealth: Payer: Self-pay | Admitting: Neurology

## 2021-03-14 NOTE — Telephone Encounter (Signed)
Dr. Terrace Arabia be will out of the office this day. I LVM for patient to call us back to reschedule and sent a mychart message.

## 2021-03-21 ENCOUNTER — Ambulatory Visit: Payer: Medicare PPO | Admitting: Neurology

## 2021-11-02 ENCOUNTER — Other Ambulatory Visit: Payer: Self-pay | Admitting: Neurology

## 2021-12-25 ENCOUNTER — Other Ambulatory Visit: Payer: Self-pay | Admitting: Neurology

## 2022-01-21 ENCOUNTER — Other Ambulatory Visit: Payer: Self-pay | Admitting: Neurology

## 2022-01-23 ENCOUNTER — Other Ambulatory Visit: Payer: Self-pay | Admitting: Neurology
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
## 2022-12-12 ENCOUNTER — Encounter (HOSPITAL_COMMUNITY): Payer: Self-pay | Admitting: *Deleted

## 2022-12-12 ENCOUNTER — Emergency Department (HOSPITAL_COMMUNITY): Payer: Medicare PPO

## 2022-12-12 ENCOUNTER — Emergency Department (HOSPITAL_COMMUNITY)
Admission: EM | Admit: 2022-12-12 | Discharge: 2022-12-13 | Disposition: A | Discharge status: Home / Self Care | Payer: Medicare PPO | Attending: Emergency Medicine | Admitting: Emergency Medicine

## 2022-12-12 ENCOUNTER — Other Ambulatory Visit: Payer: Self-pay

## 2022-12-12 DIAGNOSIS — R519 Headache, unspecified: Secondary | ICD-10-CM | POA: Insufficient documentation

## 2022-12-12 DIAGNOSIS — Z7982 Long term (current) use of aspirin: Secondary | ICD-10-CM | POA: Insufficient documentation

## 2022-12-12 DIAGNOSIS — W1839XA Other fall on same level, initial encounter: Secondary | ICD-10-CM | POA: Insufficient documentation

## 2022-12-12 DIAGNOSIS — Z794 Long term (current) use of insulin: Secondary | ICD-10-CM | POA: Insufficient documentation

## 2022-12-12 DIAGNOSIS — Z79899 Other long term (current) drug therapy: Secondary | ICD-10-CM | POA: Insufficient documentation

## 2022-12-12 DIAGNOSIS — I10 Essential (primary) hypertension: Secondary | ICD-10-CM | POA: Diagnosis not present

## 2022-12-12 DIAGNOSIS — S22080A Wedge compression fracture of T11-T12 vertebra, initial encounter for closed fracture: Secondary | ICD-10-CM | POA: Diagnosis not present

## 2022-12-12 DIAGNOSIS — F0390 Unspecified dementia without behavioral disturbance: Secondary | ICD-10-CM | POA: Diagnosis not present

## 2022-12-12 DIAGNOSIS — M545 Low back pain, unspecified: Secondary | ICD-10-CM | POA: Insufficient documentation

## 2022-12-12 DIAGNOSIS — W19XXXA Unspecified fall, initial encounter: Secondary | ICD-10-CM

## 2022-12-12 DIAGNOSIS — S24104A Unspecified injury at T11-T12 level of thoracic spinal cord, initial encounter: Secondary | ICD-10-CM | POA: Diagnosis present

## 2022-12-12 DIAGNOSIS — E119 Type 2 diabetes mellitus without complications: Secondary | ICD-10-CM | POA: Diagnosis not present

## 2022-12-12 LAB — BASIC METABOLIC PANEL
Anion gap: 10 (ref 5–15)
BUN: 24 mg/dL — ABNORMAL HIGH (ref 8–23)
CO2: 26 mmol/L (ref 22–32)
Calcium: 8.9 mg/dL (ref 8.9–10.3)
Chloride: 102 mmol/L (ref 98–111)
Creatinine, Ser: 1.13 mg/dL — ABNORMAL HIGH (ref 0.44–1.00)
GFR, Estimated: 47 mL/min — ABNORMAL LOW (ref >60)
Glucose, Bld: 296 mg/dL — ABNORMAL HIGH (ref 70–99)
Potassium: 3.8 mmol/L (ref 3.5–5.1)
Sodium: 138 mmol/L (ref 135–145)

## 2022-12-12 LAB — URINALYSIS, ROUTINE W REFLEX MICROSCOPIC
Bilirubin Urine: NEGATIVE
Glucose, UA: 150 mg/dL — AB
Hgb urine dipstick: NEGATIVE
Ketones, ur: 5 mg/dL — AB
Nitrite: NEGATIVE
Protein, ur: NEGATIVE mg/dL
Specific Gravity, Urine: 1.013 (ref 1.005–1.030)
pH: 5 (ref 5.0–8.0)

## 2022-12-12 LAB — CBC WITH DIFFERENTIAL/PLATELET
Abs Immature Granulocytes: 0.03 10*3/uL (ref 0.00–0.07)
Basophils Absolute: 0.1 10*3/uL (ref 0.0–0.1)
Basophils Relative: 1 %
Eosinophils Absolute: 0.4 10*3/uL (ref 0.0–0.5)
Eosinophils Relative: 4 %
HCT: 43.3 % (ref 36.0–46.0)
Hemoglobin: 13.8 g/dL (ref 12.0–15.0)
Immature Granulocytes: 0 %
Lymphocytes Relative: 15 %
Lymphs Abs: 1.4 10*3/uL (ref 0.7–4.0)
MCH: 31.7 pg (ref 26.0–34.0)
MCHC: 31.9 g/dL (ref 30.0–36.0)
MCV: 99.3 fL (ref 80.0–100.0)
Monocytes Absolute: 0.6 10*3/uL (ref 0.1–1.0)
Monocytes Relative: 6 %
Neutro Abs: 6.7 10*3/uL (ref 1.7–7.7)
Neutrophils Relative %: 74 %
Platelets: 133 10*3/uL — ABNORMAL LOW (ref 150–400)
RBC: 4.36 MIL/uL (ref 3.87–5.11)
RDW: 13.8 % (ref 11.5–15.5)
WBC: 9.1 10*3/uL (ref 4.0–10.5)
nRBC: 0 % (ref 0.0–0.2)

## 2022-12-12 LAB — MAGNESIUM: Magnesium: 1.6 mg/dL — ABNORMAL LOW (ref 1.7–2.4)

## 2022-12-12 MED ORDER — AMLODIPINE BESYLATE 5 MG PO TABS
10.0000 mg | ORAL_TABLET | Freq: Once | ORAL | Status: AC
  Administered 2022-12-12: 10 mg via ORAL
  Filled 2022-12-12: qty 2

## 2022-12-12 MED ORDER — ACETAMINOPHEN 500 MG PO TABS
1000.0000 mg | ORAL_TABLET | Freq: Once | ORAL | Status: AC
  Administered 2022-12-12: 1000 mg via ORAL
  Filled 2022-12-12: qty 2

## 2022-12-12 NOTE — Progress Notes (Signed)
Orthopedic Tech Progress Note Patient Details:  Cynthia Dunlap 1933/11/07 [id number]  Ortho Devices Type of Ortho Device: Lumbar corsett Ortho Device/Splint Location: Back Ortho Device/Splint Interventions: Ordered      Al Decant 12/12/2022, 9:59 PM

## 2022-12-12 NOTE — ED Provider Notes (Signed)
EMERGENCY DEPARTMENT AT [facility] Provider Note   CSN: [id number] Arrival date & time: 12/12/22  1433     History {Add pertinent medical, surgical, social history, OB history to HPI:1} Chief Complaint  Patient presents with   Cynthia Dunlap is a 89 y.o. female with vascular dementia, syncope, who presents with fall last night. C/o back pain. Pt eating and drinking while in waiting room. Endorses hitting her head, did not lose consciousness. Has had increased falls for 3-4 years every 3 months. Not falling every day. Endorses and also denies cough. Denies f/c, CP, abdominal pain, N/V/D. Endorses headache but has that all the time, no new headache since the fall. Endorses thoracic and lumbar midline back pain as well as posterior headache. She self caths at home, no report of urinary sxs.    Fall       Home Medications Prior to Admission medications   Medication Sig Start Date End Date Taking? Authorizing Provider  acetaminophen (TYLENOL) 500 MG tablet Take 1,000 mg by mouth every 6 (six) hours as needed for moderate pain or headache.    [provider]  amitriptyline (ELAVIL) 25 MG tablet Take 25 mg by mouth at bedtime. 01/20/20   [provider]  amLODipine (NORVASC) 10 MG tablet Take 10 mg by mouth daily. 03/30/15   [provider]  aspirin 81 MG EC tablet Take 1 tablet (81 mg total) by mouth daily. Swallow whole.  Hold for a week, resume when ok with pcp 03/21/20   Albertine Grates, MD  atorvastatin (LIPITOR) 80 MG tablet Take 1 tablet (80 mg total) by mouth daily. Hold for a week, pcp to repeat liver function test and ck level, resume lipitor when ok with your primary care physician. 03/21/20   Albertine Grates, MD  baclofen (LIORESAL) 10 MG tablet Take 10 mg by mouth 2 (two) times daily as needed for muscle spasms.    [provider]  Carboxymethylcellul-Glycerin (LUBRICATING EYE DROPS OP) Place 1 drop into both eyes daily as  needed (dry eyes).    [provider]  Cholecalciferol (VITAMIN D) 125 MCG (5000 UT) CAPS Take 5,000 Units by mouth daily.    [provider]  CREON 24000-76000 units CPEP Take 2 capsules by mouth 2 (two) times daily with a meal. 10/09/19   [provider]  ezetimibe (ZETIA) 10 MG tablet Take 10 mg by mouth daily. 03/02/20   [provider]  ibuprofen (ADVIL) 200 MG tablet Take 400 mg by mouth every 6 (six) hours as needed for moderate pain or headache.    [provider]  insulin aspart protamine- aspart (NOVOLOG MIX 70/30) (70-30) 100 UNIT/ML injection Inject 10-20 Units into the skin 2 (two) times daily with a meal.    [provider]  levETIRAcetam (KEPPRA) 500 MG tablet TAKE 1 TABLET TWICE DAILY (NEED MD APPOINTMENT FOR REFILLS) 01/23/22   Levert Feinstein, MD  levocetirizine (XYZAL) 5 MG tablet Take 5 mg by mouth every evening.    [provider]  losartan (COZAAR) 100 MG tablet Take 100 mg by mouth daily.    [provider]  metoprolol succinate (TOPROL-XL) 50 MG 24 hr tablet Take 50 mg by mouth daily. 01/20/20   [provider]  pantoprazole (PROTONIX) 40 MG tablet Take 40 mg by mouth daily. 03/25/20   [provider]      Allergies    Codeine, Darvon [propoxyphene], Gabapentin, Levofloxacin, and Lovastatin  Review of Systems   Review of Systems A 10 point review of systems was performed and is negative unless otherwise reported in HPI.  Physical Exam Updated Vital Signs BP (!) 196/81   Pulse 74   Temp 98.4 F (36.9 C) (Oral)   Resp 20   Ht 5\' 9"  (1.753 m)   Wt 70.3 kg   SpO2 90%   BMI 22.89 kg/m  Physical Exam General: Normal appearing elderly female, lying in bed.  HEENT: PERRLA, EOMI, Sclera anicteric, dry mucous membranes, trachea midline.  Cardiology: RRR, no murmurs/rubs/gallops. BL radial and DP pulses equal bilaterally.  Resp: Normal respiratory rate and effort. CTAB, no wheezes,  rhonchi, crackles.  Abd: Soft, non-tender, non-distended. No rebound tenderness or guarding.  GU: Deferred. MSK: No peripheral edema or signs of trauma. Extremities without deformity or TTP. No cyanosis or clubbing. Skin: warm, dry. No rashes or lesions. Back: No midline C-spine tenderness palpation deformities or step-offs.  Midline thoracic spine pain with palpation around T8-10 and lumbar spine pain around L5, no deformities or step-offs. Neuro: A&Ox4, CNs II-XII grossly intact. 5/5 strength all extremities. Sensation grossly intact.  Psych: Normal mood and affect.   ED Results / Procedures / Treatments   Labs (all labs ordered are listed, but only abnormal results are displayed) Labs Reviewed  CBC WITH DIFFERENTIAL/PLATELET - Abnormal; Notable for the following components:      Result Value   Platelets 133 (*)    All other components within normal limits  URINALYSIS, ROUTINE W REFLEX MICROSCOPIC - Abnormal; Notable for the following components:   APPearance HAZY (*)    Glucose, UA 150 (*)    Ketones, ur 5 (*)    Leukocytes,Ua TRACE (*)    Bacteria, UA FEW (*)    All other components within normal limits  BASIC METABOLIC PANEL  MAGNESIUM    EKG EKG Interpretation Date/Time:  Tuesday December 12 2022 17:57:22 EDT Ventricular Rate:  80 PR Interval:  181 QRS Duration:  135 QT Interval:  439 QTC Calculation: 507 R Axis:   -53  Text Interpretation: Sinus rhythm Left bundle branch block Similar to prior Confirmed by Vivi Barrack [phone]) on 12/12/2022 6:01:37 PM  Radiology DG Chest 2 View  Result Date: 12/12/2022 CLINICAL DATA:  fall EXAM: CHEST - 2 VIEW COMPARISON:  CXR 03/18/20 FINDINGS: The heart size and mediastinal contours are within normal limits. No pleural effusion. No pneumothorax. There are prominent bilateral interstitial opacities seen in the setting of pulmonary venous congestion or infection. The visualized skeletal structures are unremarkable. IMPRESSION: Prominent  bilateral interstitial opacities, which could be seen in the setting of pulmonary venous congestion or infection. No radiographic evidence of thoracic injury. Electronically Signed   By: Lorenza Cambridge M.D.   On: 12/12/2022 19:05   DG Pelvis 1-2 Views  Result Date: 12/12/2022 CLINICAL DATA:  Recent fall with pelvic pain, initial encounter EXAM: PELVIS - 1 VIEW COMPARISON:  03/18/2020 FINDINGS: There is no evidence of pelvic fracture or diastasis. No pelvic bone lesions are seen. Prior L3 vertebral augmentation is seen. IMPRESSION: No acute abnormality noted. Electronically Signed   By: Alcide Clever M.D.   On: 12/12/2022 19:03    Procedures Procedures  {Document cardiac monitor, telemetry assessment procedure when appropriate:1}  Medications Ordered in ED Medications  acetaminophen (TYLENOL) tablet 1,000 mg (1,000 mg Oral Given 12/12/22 1948)  amLODipine (NORVASC) tablet 10 mg (10 mg Oral Given 12/12/22 1948)    ED Course/ Medical Decision Making/ A&P  Medical Decision Making Amount and/or Complexity of Data Reviewed Labs: ordered. Radiology: ordered. Decision-making details documented in ED Course.  Risk OTC drugs. Prescription drug management.    This patient presents to the ED for concern of fall, back pain, headache; this involves an extensive number of treatment options, and is a complaint that carries with it a high risk of complications and morbidity.  I considered the following differential and admission for this acute, potentially life threatening condition.   MDM:    DDX for trauma includes but is not limited to:  -Head Injury such as skull fx or ICH-reports headache and head strike with the fall, will rule out ICH with CT head -Chest Injury and Abdominal Injury -patient does not any chest pain or abdominal pain.  Given age and fall will get chest and pelvis x-ray to further evaluate though she has been ambulatory since the incident -Fractures -high  degree of concern for thoracic or lumbar spinal fractures given report of fall and midline tenderness to palpation.  Will get CTs to further evaluate -Patient initially reported increased falls recently however on further history it appears that they have been every 3 to 4 months for the last several years.  Will obtain baseline labs and urine to rule out electrolyte derangements, renal injury, significant dehydration, or UTI as a cause of her falls.   Clinical Course as of 12/12/22 2037  Tue Dec 12, 2022  1913 DG Chest 2 View Prominent bilateral interstitial opacities, which could be seen in the setting of pulmonary venous congestion or infection. No radiographic evidence of thoracic injury.   [HN]  1930 Patient fell down at home out of a chair yesterday at about 8 pm. +Hit her head, did not lose consciousness when she fell. Has had several falls recently.  [HN]    Clinical Course User Index [HN] Loetta Rough, MD    Labs: I Ordered, and personally interpreted labs.  The pertinent results include: Those listed above  Imaging Studies ordered: I ordered imaging studies including chest x-ray, pelvis x-ray, CT head C-spine T-spine and L-spine I independently visualized and interpreted imaging. I agree with the radiologist interpretation  Additional history obtained from chart review, son at bedside.    Cardiac Monitoring: The patient was maintained on a cardiac monitor.  I personally viewed and interpreted the cardiac monitored which showed an underlying rhythm of: Normal sinus rhythm  Reevaluation: After the interventions noted above, I reevaluated the patient and found that they have :{resolved/improved/worsened:23923::"improved"}  Social Determinants of Health:  lives with her son  Disposition:  ***  Co morbidities that complicate the patient evaluation  Past Medical History:  Diagnosis Date   Bulging lumbar disc    Chest pain    Diabetes mellitus without complication  (HCC)    GERD (gastroesophageal reflux disease)    Hypercholesterolemia    Hyperlipemia    Hypertension    HZ (herpes zoster)    Myocardial ischemia    Osteoarthrosis    PAD (peripheral artery disease) (HCC)    Parkinson disease    Plantar fasciitis    Polyneuropathy in diabetes (HCC)    Pulmonary hypertension (HCC)    Retinopathy, background, diabetic (HCC)    Rheumatism    Seasonal allergies    Urinary urgency      Medicines Meds ordered this encounter  Medications   acetaminophen (TYLENOL) tablet 1,000 mg   amLODipine (NORVASC) tablet 10 mg    I have reviewed the patients home medicines and  have made adjustments as needed  Problem List / ED Course: Problem List Items Addressed This Visit   None        {Document critical care time when appropriate:1} {Document review of labs and clinical decision tools ie heart score, Chads2Vasc2 etc:1}  {Document your independent review of radiology images, and any outside records:1} {Document your discussion with family members, caretakers, and with consultants:1} {Document social determinants of health affecting pt's care:1} {Document your decision making why or why not admission, treatments were needed:1}  This note was created using dictation software, which may contain spelling or grammatical errors.

## 2022-12-12 NOTE — ED Notes (Signed)
ED Provider at bedside. 

## 2022-12-12 NOTE — ED Triage Notes (Signed)
Pt with fall last night. C/o back pain.  Pt eating and drinking while in waiting room.  Denies hitting her head with fall.

## 2022-12-12 NOTE — Discharge Instructions (Addendum)
Thank you for coming to [facility] Emergency Department. You were seen for fall and back pain. We did an exam, labs, and imaging, and these showed high blood pressure as well as a compression fracture of your T12 vertebrae in your spine. You were recommended to have a brace but you elected to leave the emergency department before it was applied.  Please follow-up with a spine doctor.  You can call [facility] located in [place] or you can find another spine doctor in [place].  Please follow up with your primary care provider within 1 week in order to follow-up about your blood pressure and take your blood pressure medicines as prescribed.  Please take your blood pressure once in the afternoon daily and recorded on the sheet provided to take to your primary care physician.  You can take tylenol 1g every 8 hours for pain control. Please use ice/heat and lidocaine patches over the counter as well for pain in your back.  Do not hesitate to return to the ED or call 911 if you experience: -Worsening symptoms -Lightheadedness, passing out - Fever (100.4 F or 38 C) or chills at home that do not respond to over the counter medications - Weakness, numbness, or tingling in your extremities - Difficulty emptying bladder / urinary incontinence - Fecal incontinence -Asymmetric weakness, numbness tingling, slurred speech, facial droop, visual changes, severe headache - Uncontrolled nausea/vomiting with inability to keep down liquids - Feeling as though you are going to pass out or passing out - Anything else that concerns you

## 2022-12-12 NOTE — ED Notes (Signed)
[facility] contacted for LSO brace, they will  be here when they get the equipment necessary.

## 2022-12-12 NOTE — ED Notes (Signed)
Pt placed on bedpan but unable to void.

## 2023-03-06 IMAGING — US IR EVAL AND MANAGEMENT
1 series · 2 of 2 positions shown · non-contrast
Comparison: none

[Series 1: ir rad eval and mgt. · 2 of 2 slices shown]
[im 1/2]
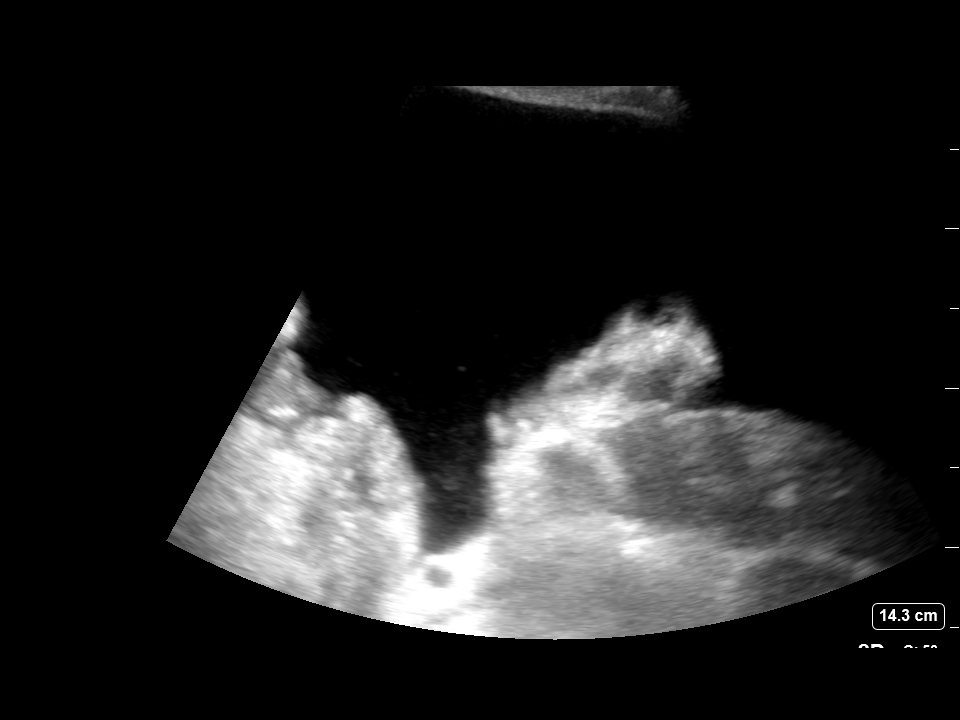
[im 2/2]
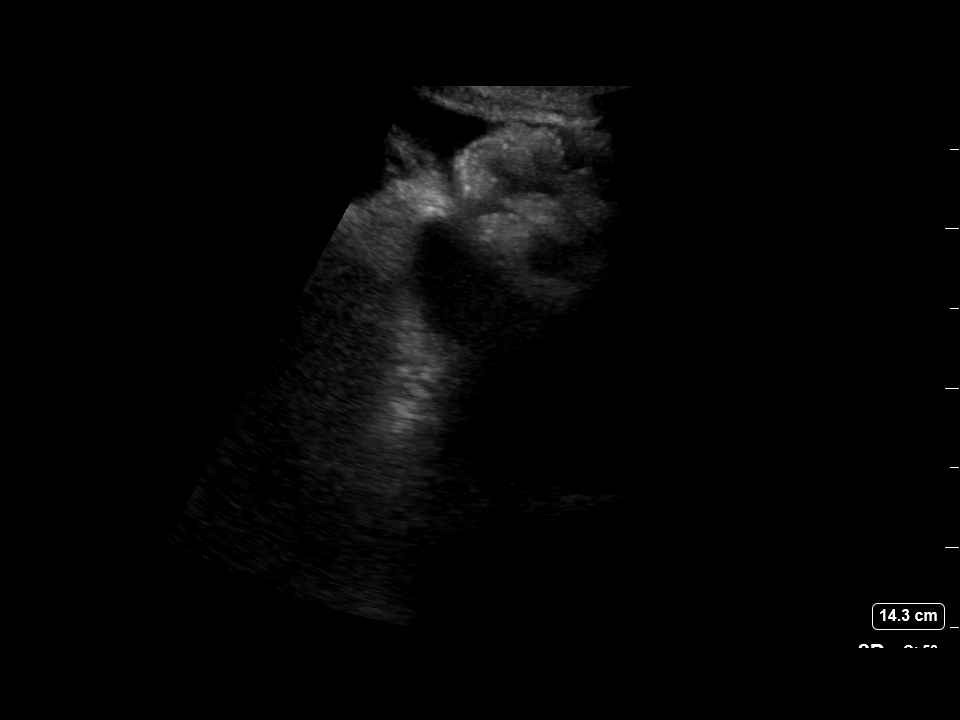

[2 of 2 positions shown; findings below may reference images not displayed]

EXAM:
NEW PATIENT OFFICE VISIT

CHIEF COMPLAINT:
Severe low back pain.

Current Pain Level: 1-10

HISTORY OF PRESENT ILLNESS:
The patient is an 86 year old lady who had been referred for
evaluation and treatment of painful compression fracture at L3.

The patient is accompanied by her son.

Briefly, the patient reports first experiencing this pain following
a fall approximately 4 weeks ago. Patient apparently fell to 1 side
and on her buttocks. This was followed by a progressively worsening
pain to a ten [DATE]. The pain is associated with spasms in the
midthoracic, and also the lower lumbar regions.

The patient reports the pain to be worse with ambulation or turning.

The patient reports radiation of the pain to the left lateral aspect
of her thigh and the left ankle.

There is no associated paresthesias or weakness associated with
this, however.

The patient reports disturbance with her sleeping pattern due to the
near constant pain.

She reports her appetite to be fairly intact with no loss of weight.

She denies any recent chills, fever or rigors.

She denies any symptoms of dysuria, frequency or hematuria.

She denies any symptoms of chest pain, shortness of breath or cough
or wheezing.

Denies any difficulty swallowing. No reflux, abdominal pain, nausea
or vomiting, constipation or diarrhea.

* Codeine:
* Nausea And Vomiting: Other reaction(s): Vomiting

.
* Codeine: Darvon Propoxyphene
* Nausea And Vomiting:

* Codeine:
* Nausea And Vomiting: Unknown reaction

.
* Codeine: Gabapentin
* Nausea And Vomiting:

* Codeine:
* Nausea And Vomiting: Other reaction(s): Dizziness

.
* Codeine: Levofloxacin
* Nausea And Vomiting:

.
* Codeine: Lovastatin
* Nausea And Vomiting: Other (See Comments)

* Codeine:
* Nausea And Vomiting: Other reaction(s): Hives

.
* Codeine: Penicillins
* Nausea And Vomiting:

* Codeine:
* Nausea And Vomiting: Hives - childhood reaction. Per pt, she has
taken as an adult without any issues.

HOME MEDICATIONS:

Current Outpatient Medications

Medication
* : Sig
* : Dispense
* : Refill

.
* : amitriptyline (ELAVIL) 25 MG tablet
* : Take 25 mg by mouth at bedtime.
* :
* :

.
* : amLODipine (NORVASC) 10 MG tablet
* : Take 1 tablet by mouth daily.
* :
* :

.
* : aspirin 81 MG EC tablet
* : Take 1 tablet (81 mg total) by mouth daily. Swallow whole. Hold
for a week, resume when ok with pcp
* : 30 tablet
* : 12

.
* : atorvastatin (LIPITOR) 80 MG tablet
* : Take 1 tablet (80 mg total) by mouth daily. Hold for a week, pcp
to repeat liver function test and ck level, resume lipitor when ok
with your primary care physician.
* :
* :

.
* : Cholecalciferol (VITAMIN D) 125 MCG (3555 UT) CAPS
* : Take 1 capsule by mouth daily.
* :
* :

.
* : VONCINA 83111-72555 units CPEP
* : Take 3 capsules by mouth 2 (two) times daily.
* :
* :

.
* : ezetimibe (ZETIA) 10 MG tablet
* : Take 10 mg by mouth daily.
* :
* :

.
* : insulin aspart protamine- aspart (NOVOLOG MIX 70/30) (70-30) 100
UNIT/ML injection
* : Inject 40 Units into the skin 2 (two) times daily with a meal.
* :
* :

.
* : levETIRAcetam (KEPPRA) 500 MG tablet
* : Take 1 tablet (500 mg total) by mouth 2 (two) times daily.
* : 60 tablet
* : 11

.
* : levocetirizine (XYZAL) 5 MG tablet
* : Take 5 mg by mouth every evening.
* :
* :

.
* : losartan (COZAAR) 100 MG tablet
* : Take 100 mg by mouth daily.
* :
* :

.
* : metoprolol succinate (TOPROL-XL) 50 MG 24 hr tablet
* : Take 50 mg by mouth daily.
* :
* :

.
* : pantoprazole (PROTONIX) 40 MG tablet
* : Take 1 tablet by mouth daily.
* :
* :

PAST MEDICAL HISTORY:

Past Medical History:

Diagnosis
* : Date

.
* : Bulging lumbar disc
* :

.
* : Chest pain
* :

.
* : Diabetes mellitus without complication (HCC)
* :

.
* : GERD (gastroesophageal reflux disease)
* :

.
* : Hypercholesterolemia
* :

.
* : Hyperlipemia
* :

.
* : Hypertension
* :

.
* : SINE (herpes zoster)
* :

.
* : Myocardial ischemia
* :

.
* : Osteoarthrosis
* :

.
* : PAD (peripheral artery disease) (HCC)
* :

.
* : Parkinson disease (HCC)
* :

.
* : Plantar fasciitis
* :

.
* : Polyneuropathy in diabetes (HCC)
* :

.
* : Pulmonary hypertension (HCC)
* :

.
* : Retinopathy, background, diabetic (HCC)
* :

.
* : Rheumatism
* :

.
* : Seasonal allergies
* :

.
* : Urinary urgency
* :

PAST SURGICAL HISTORY:

Past Surgical History:

Procedure
* : Laterality
* : Date

.
* : APPENDECTOMY
* :
* :

.
* : BACK SURGERY
* :
* :

.
* : CATARACT EXTRACTION
* :
* :

.
* : CHOLECYSTECTOMY
* :
* :

.
* : SHOULDER SURGERY
* : Bilateral
* :

.
* : TUBAL LIGATION
* :
* :

FAMILY HISTORY:

Family History

Problem
* : Relation
* : Age of Onset

.
* : Other
* : Mother
* :

* :     unsure of medical history

.
* : Other
* : Father
* :

* :     unsure of medical history

.
* : Other
* : Son
* :

* :     benign brain tumor removed

.
* : Suicidality
* : Son
* :

SOCIAL HISTORY:

Socioeconomic History

.
* : Marital status:
* : Widowed

* :
* : Spouse name:
* : Not on file

.
* : Number of children:
* : 2

.
* : Years of education:
* : 12

.
* : Highest education level:
* : High school graduate

Occupational History

.
* : Occupation:
* : Retired

Tobacco Use

.
* : Smoking status:
* : Never Smoker

.
* : Smokeless tobacco:
* : Never Used

Substance and Sexual Activity

.
* : Alcohol use:
* : Not Currently

.
* : Drug use:
* : Not on file

.
* : Sexual activity:
* : Not on file

Other Topics
* : Concern

.
* : Not on file

Social History Narrative

* : Lives with her son.

* : Her daughter-in-law lives next door.

* : Right-handed.

* : Caffeine use: one cup coffee per day.

Food Insecurity: Not on file

Transportation Needs: Not on file

Physical Activity: Not on file

Stress: Not on file

Social Connections: Not on file

Intimate Partner Violence: Not on file

REVIEW OF SYSTEMS:
As above.

PHYSICAL EXAMINATION:
Patient in moderate distress on account of her back pain. Her affect
was appropriate to the situation.

Responses appropriate.  Normal eye contact.

Exquisitely tenderness in the upper lumbar L3 region, less so at the
T8-T9 region.

ASSESSMENT AND PLAN:
Patient's MRI findings were reviewed with the patient and her son.

Brought to their attention was the L3 vertebral body compression
fracture associated with minimal retropulsion.

Given her severe debilitating pain, the patient was felt to be
appropriate for vertebral body augmentation with kyphoplasty for
pain relief, and also prevent further collapse of the L3.

The procedure, the benefits, the risks and alternatives were
reviewed with both of them.

Questions were answered to their satisfaction. Patient is agreeable
to proceed with vertebral body augmentation with a
kyphoplasty/vertebroplasty.

This will be scheduled at the earliest possible.

## 2023-03-16 IMAGING — XA IR KYPHO VERTEBRAL LUMBAR AUGMENTATION
1 series · 13 of 16 positions shown · non-contrast
Comparison: Recent MRI of the lumbosacral spine.

INDICATION: Severe low back pain secondary to compression fracture at L3.

EXAM:
BALLOON KYPHOPLASTY AT L3

[Series 300: spine · 13 of 16 slices shown]
[im 1/16]
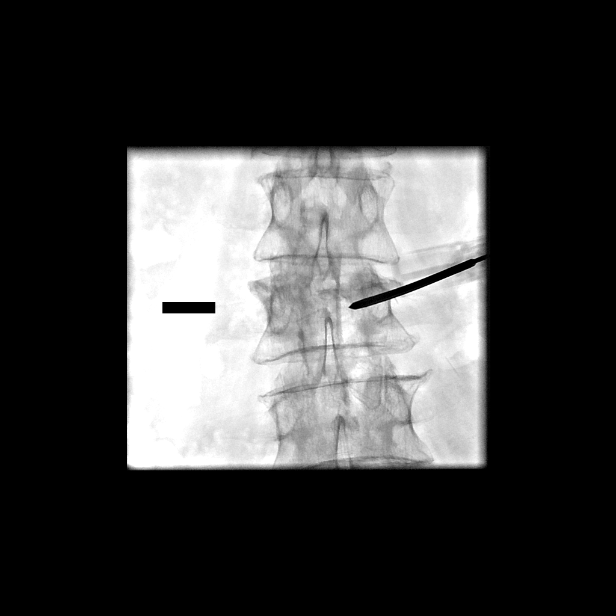
[im 2/16]
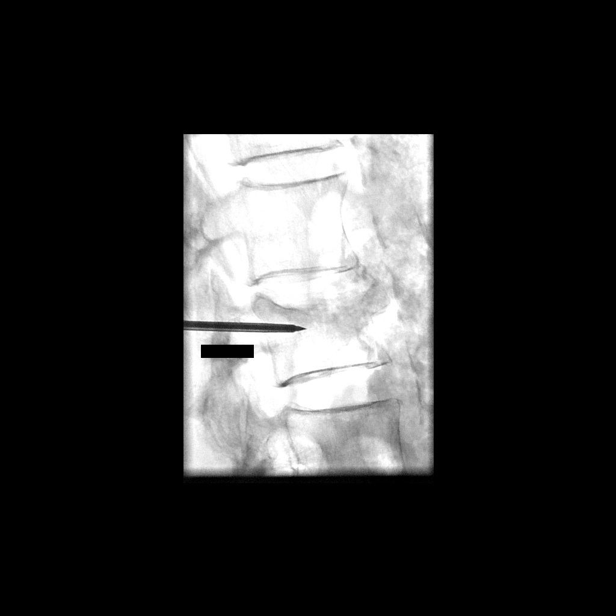
[im 4/16]
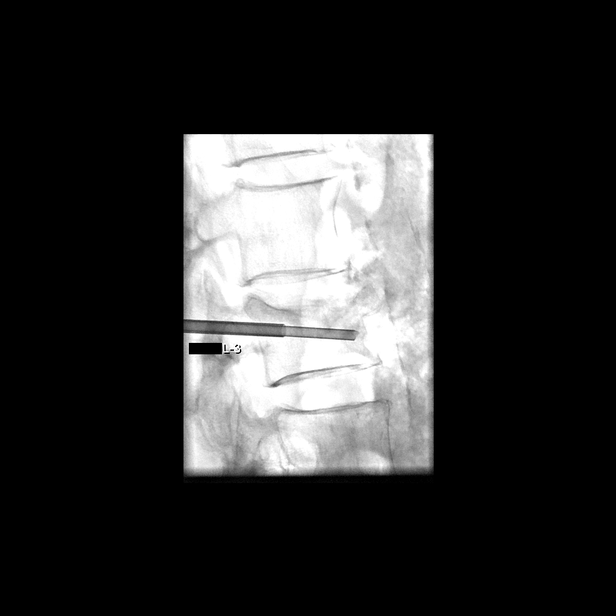
[im 5/16]
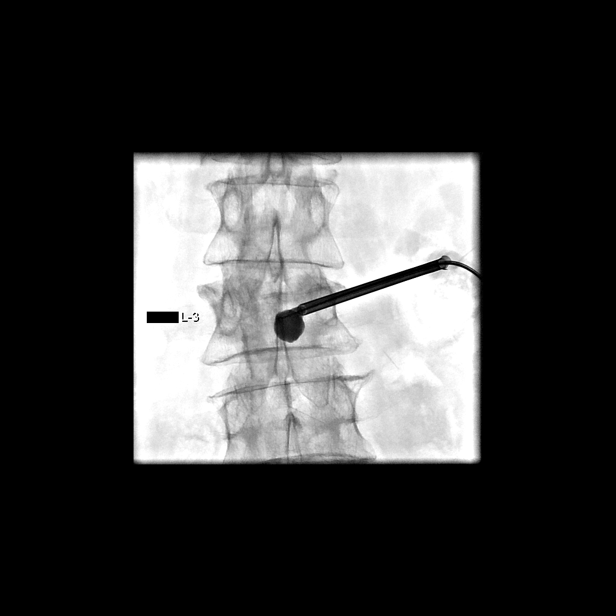
[im 6/16]
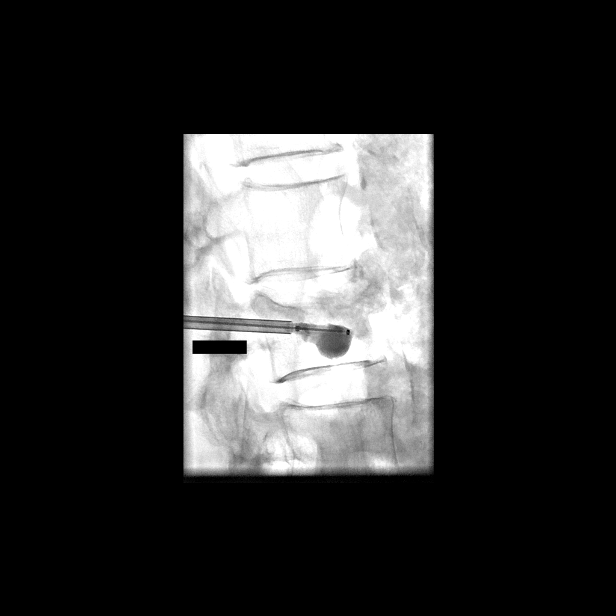
[im 7/16]
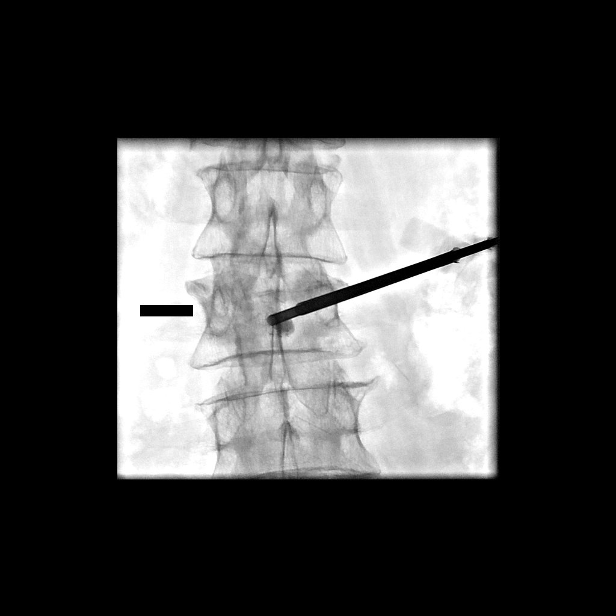
[im 9/16]
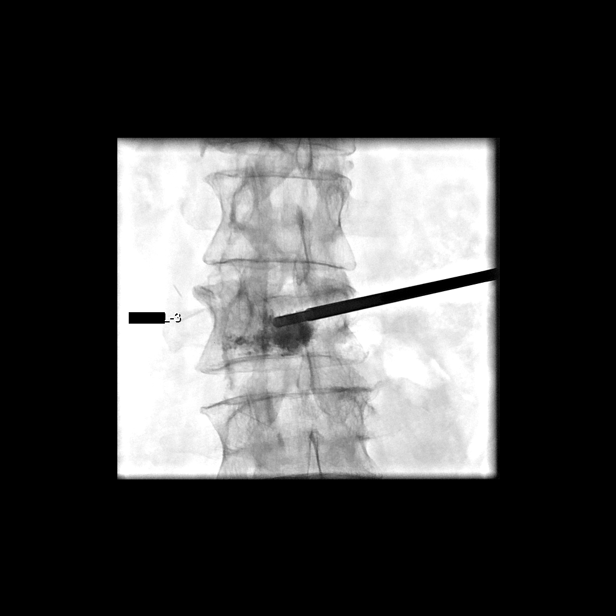
[im 10/16]
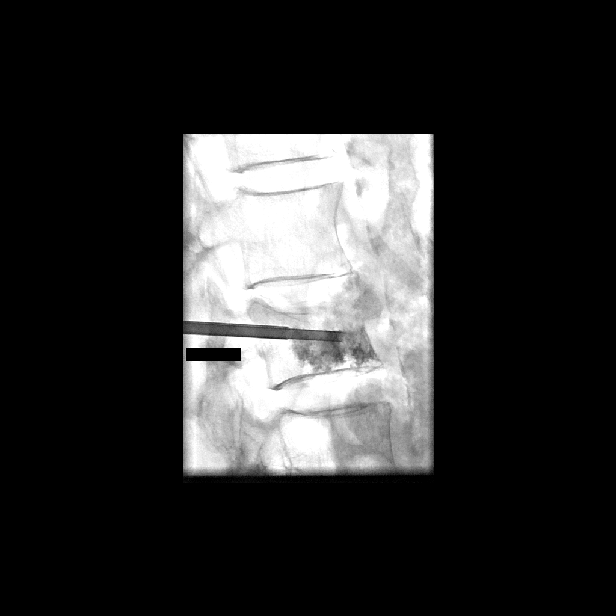
[im 11/16]
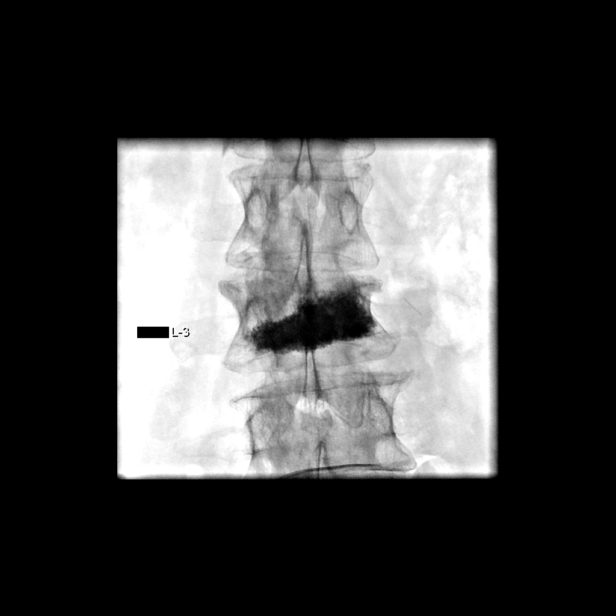
[im 12/16]
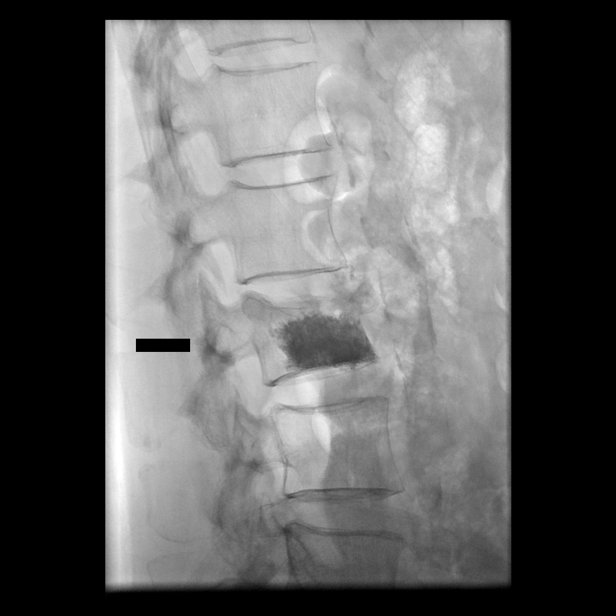
[im 13/16]
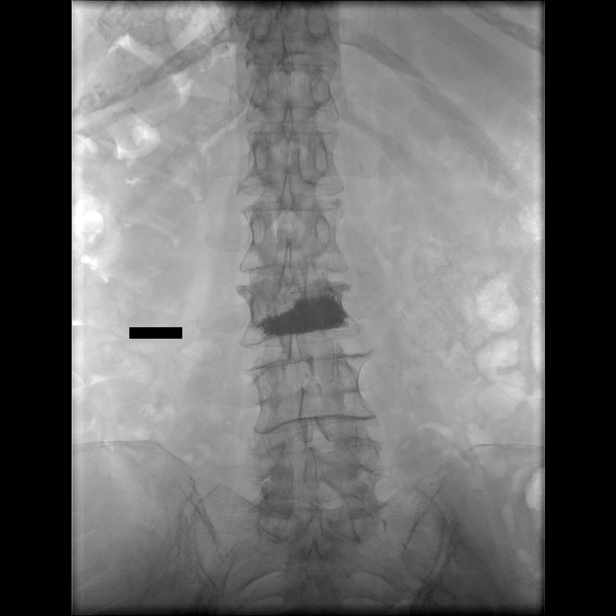
[im 15/16]
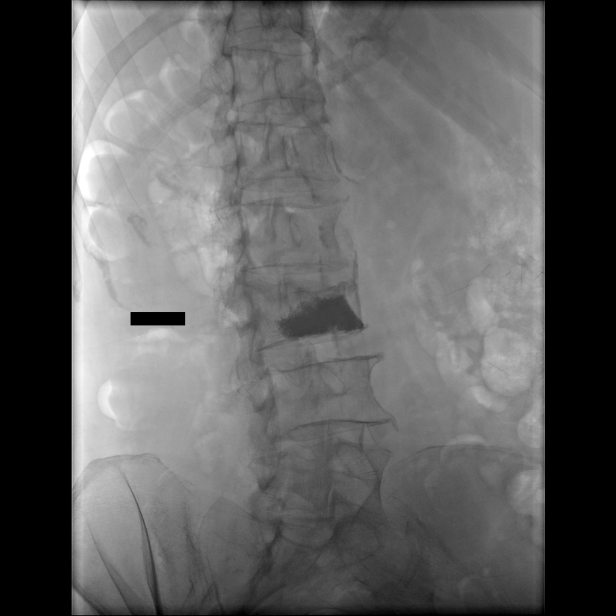
[im 16/16]
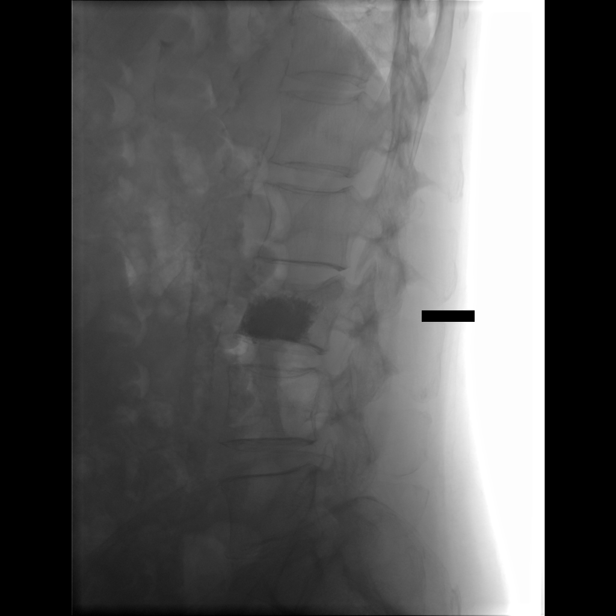

[13 of 16 positions shown; findings below may reference images not displayed]

MEDICATIONS:
As antibiotic prophylaxis, vancomycin 1 g IV was ordered
pre-procedure and administered intravenously within 1 hour of
incision.

ANESTHESIA/SEDATION:
Moderate (conscious) sedation was employed during this procedure. A
total of Versed 3 mg and Fentanyl 75 mcg was administered
intravenously.

Moderate Sedation Time: 32 minutes. The patient's level of
consciousness and vital signs were monitored continuously by
radiology nursing throughout the procedure under my direct
supervision.

FLUOROSCOPY TIME:  Fluoroscopy Time: 8 minutes 42 seconds (767 mGy)

COMPLICATIONS:
None immediate.

PROCEDURE:
Following a full explanation of the procedure along with the
potential associated complications, an informed witnessed consent
was obtained.

The patient was placed prone on the fluoroscopic table. The skin
overlying the lumbar region was then prepped and draped in the usual
sterile fashion. The right pedicle at L3 was then infiltrated with
0.25% bupivacaine followed by the advancement of an 11-gauge
Jamshidi needle through the right pedicle into the posterior
one-third at L3. This was then exchanged for a Kyphon advanced osteo
introducer system comprised of a working cannula and a Kyphon osteo
drill.

This combination was then advanced over a Kyphon osteo bone pin
until the tip of the Kyphon osteo drill was in the posterior third
at L3.

At this time, the bone pin was removed. In a medial trajectory, the
combination was advanced until the tip of the working cannula was
inside the posterior one-third at L3.

The osteo drill was removed and a core sample sent for pathologic
analysis.

Through the working cannula, a Kyphon bone biopsy device was
advanced to within 5 mm of the anterior aspect of L3. A core sample
from this was also sent for pathologic analysis.

Through the working cannula, a Kyphon inflatable bone tamp 20 x 3
was advanced and positioned with the distal marker 5 mm from the
anterior aspect of L3. Crossing of the midline was seen on the AP
projection. At this time, the balloon was expanded using contrast
via a Kyphon inflation syringe device via microtubing.

Inflations were continued until there was apposition with the
superior and the inferior endplates.

At this time, methylmethacrylate mixture was reconstituted with
Tobramycin in the Kyphon bone mixing device system. This was then
loaded onto the Kyphon bone fillers.

The balloon was deflated and removed followed by the instillation of
6 bone filler equivalents of methylmethacrylate mixture at with
excellent filling in the AP and lateral projections. No
extravasation was noted in the disk spaces or posteriorly into the
spinal canal. No epidural venous contamination was seen.

The working cannula and the bone filler were then retrieved and
removed. Hemostasis was achieved at the skin entry site.
IMPRESSION: 1. Status post fluoroscopic-guided needle placement for deep core
bone biopsy at L3.

2. Status post vertebral body augmentation using balloon kyphoplasty
at L3 as described without event.
3. Patient was advised to call her referring K[facility] for results of the
biopsy. Patient expressed understanding and agreement with the above
management plan.

## 2024-04-12 DEATH — deceased
